# Patient Record
Sex: Female | Born: 1980 | Race: Asian | Hispanic: No | Marital: Single | Smoking: Never smoker
Health system: Southern US, Community
[De-identification: ages and names within clinical notes are randomized; demographics above are authoritative.]

## PROBLEM LIST (undated history)

## (undated) DIAGNOSIS — E282 Polycystic ovarian syndrome: Secondary | ICD-10-CM

## (undated) DIAGNOSIS — E119 Type 2 diabetes mellitus without complications: Secondary | ICD-10-CM

## (undated) HISTORY — PX: APPENDECTOMY: SHX54

## (undated) HISTORY — DX: Polycystic ovarian syndrome: E28.2

---

## 1999-09-14 ENCOUNTER — Ambulatory Visit (HOSPITAL_COMMUNITY): Admission: RE | Admit: 1999-09-14 | Discharge: 1999-09-14 | Payer: Self-pay | Admitting: Family Medicine

## 1999-11-04 ENCOUNTER — Other Ambulatory Visit: Admission: RE | Admit: 1999-11-04 | Discharge: 1999-11-04 | Payer: Self-pay | Admitting: Internal Medicine

## 2001-11-08 ENCOUNTER — Encounter: Payer: Self-pay | Admitting: Family Medicine

## 2001-11-08 ENCOUNTER — Encounter: Admission: RE | Admit: 2001-11-08 | Discharge: 2001-11-08 | Payer: Self-pay | Admitting: Family Medicine

## 2002-07-10 ENCOUNTER — Encounter: Admission: RE | Admit: 2002-07-10 | Discharge: 2002-07-10 | Payer: Self-pay | Admitting: Family Medicine

## 2002-07-10 ENCOUNTER — Encounter: Payer: Self-pay | Admitting: Family Medicine

## 2003-01-27 ENCOUNTER — Encounter: Admission: RE | Admit: 2003-01-27 | Discharge: 2003-01-27 | Payer: Self-pay | Admitting: Family Medicine

## 2003-01-27 ENCOUNTER — Encounter: Payer: Self-pay | Admitting: Family Medicine

## 2003-05-19 ENCOUNTER — Inpatient Hospital Stay (HOSPITAL_COMMUNITY): Admission: EM | Admit: 2003-05-19 | Discharge: 2003-05-20 | Payer: Self-pay | Admitting: Emergency Medicine

## 2003-05-19 ENCOUNTER — Encounter (INDEPENDENT_AMBULATORY_CARE_PROVIDER_SITE_OTHER): Payer: Self-pay

## 2003-05-19 ENCOUNTER — Encounter: Payer: Self-pay | Admitting: Emergency Medicine

## 2005-12-27 ENCOUNTER — Other Ambulatory Visit: Admission: RE | Admit: 2005-12-27 | Discharge: 2005-12-27 | Payer: Self-pay | Admitting: Obstetrics and Gynecology

## 2006-03-19 ENCOUNTER — Encounter (INDEPENDENT_AMBULATORY_CARE_PROVIDER_SITE_OTHER): Payer: Self-pay | Admitting: Family Medicine

## 2006-08-30 ENCOUNTER — Encounter (INDEPENDENT_AMBULATORY_CARE_PROVIDER_SITE_OTHER): Payer: Self-pay | Admitting: Specialist

## 2006-08-30 ENCOUNTER — Other Ambulatory Visit: Admission: RE | Admit: 2006-08-30 | Discharge: 2006-08-30 | Payer: Self-pay | Admitting: Family Medicine

## 2006-08-30 ENCOUNTER — Ambulatory Visit: Payer: Self-pay | Admitting: Family Medicine

## 2006-08-30 LAB — CONVERTED CEMR LAB
HCT: 39.9 % (ref 36.0–46.0)
Hemoglobin: 13.5 g/dL (ref 12.0–15.0)
MCHC: 33.9 g/dL (ref 30.0–36.0)
MCV: 91.3 fL (ref 78.0–100.0)
Platelets: 379 10*3/uL (ref 150–400)
RBC: 4.37 M/uL (ref 3.87–5.11)
RDW: 12.2 % (ref 11.5–14.6)
TSH: 1.2 microintl units/mL (ref 0.35–5.50)
WBC: 5.7 10*3/uL (ref 4.5–10.5)

## 2006-12-13 ENCOUNTER — Ambulatory Visit: Payer: Self-pay | Admitting: Family Medicine

## 2006-12-27 ENCOUNTER — Ambulatory Visit: Payer: Self-pay | Admitting: Family Medicine

## 2006-12-27 LAB — CONVERTED CEMR LAB: Varicella IgG: 2.92 — ABNORMAL HIGH

## 2007-02-26 DIAGNOSIS — J309 Allergic rhinitis, unspecified: Secondary | ICD-10-CM | POA: Insufficient documentation

## 2007-02-28 ENCOUNTER — Ambulatory Visit: Payer: Self-pay | Admitting: Family Medicine

## 2009-01-04 ENCOUNTER — Emergency Department (HOSPITAL_COMMUNITY): Admission: EM | Admit: 2009-01-04 | Discharge: 2009-01-05 | Payer: Self-pay | Admitting: Emergency Medicine

## 2009-05-27 IMAGING — CT CT ANGIO CHEST
2 of 6 series · 19 of 36 positions shown · IV contrast (APPLIED)
Comparison: None.

CLINICAL DATA: Shortness of breath, cough, chest pain.

CT ANGIOGRAPHY CHEST
TECHNIQUE: Multidetector CT imaging of the chest was performed
using the standard protocol during bolus administration of
intravenous contrast. Multiplanar CT image reconstructions
including MIPs were obtained to evaluate the vascular anatomy.
Contrast: 80 ml 0mnipaque-LII

[Series 8: pe thins @ 1mm · axial · 0.56mm/px · z∈[-308,-106]mm · 18 of 226 slices shown]
[im 12/226  lung]
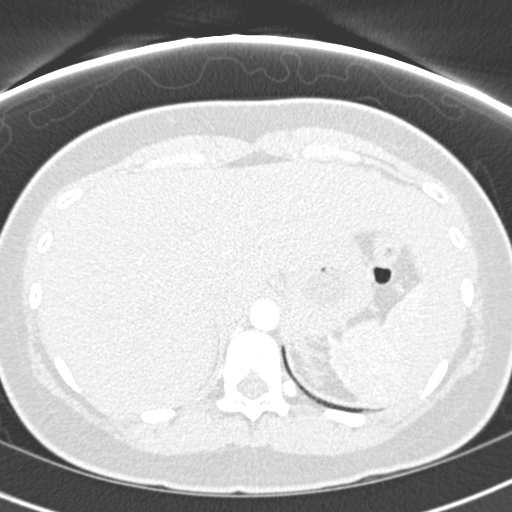
[im 23/226  mediastinal]
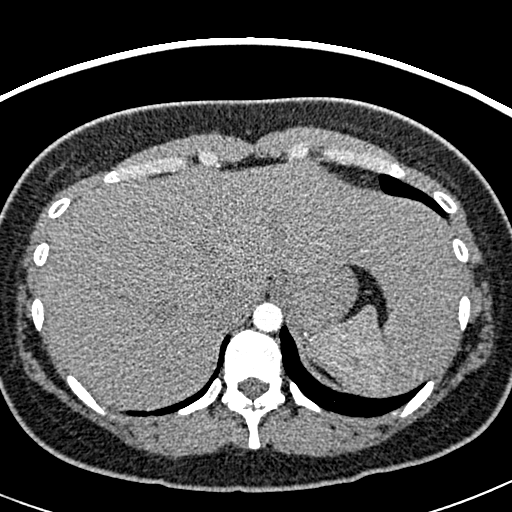
[im 34/226  lung]
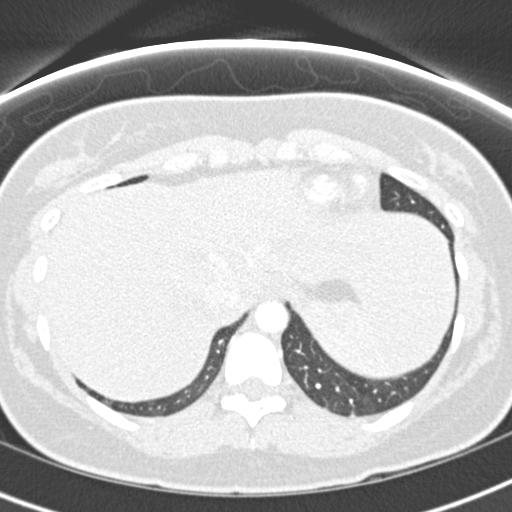
[im 46/226  mediastinal]
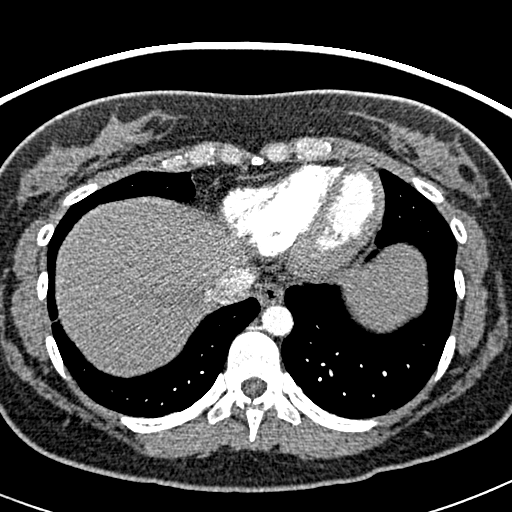
[im 57/226  lung]
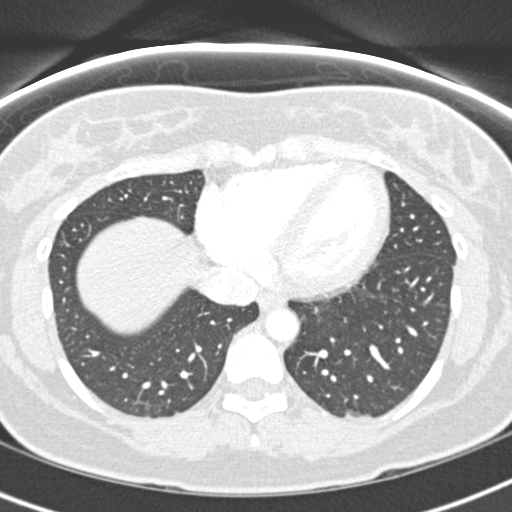
[im 68/226  mediastinal]
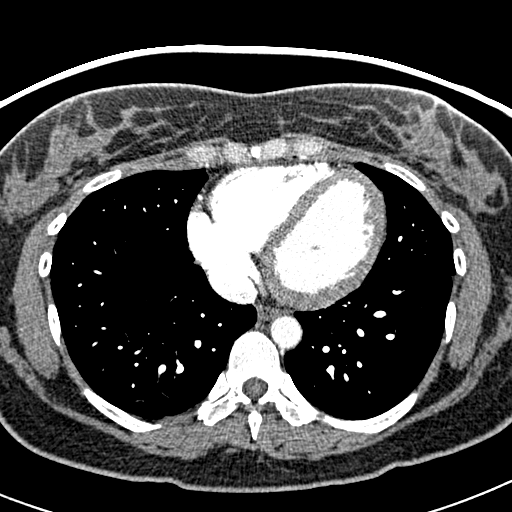
[im 79/226  lung]
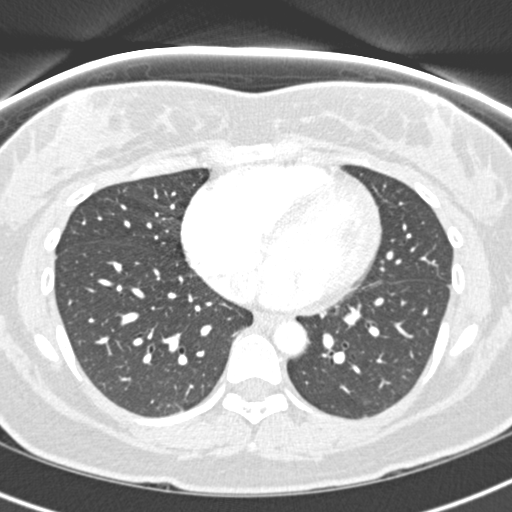
[im 91/226  mediastinal]
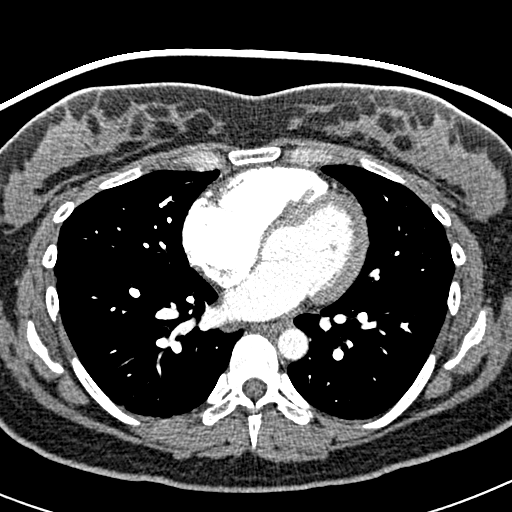
[im 102/226  lung]
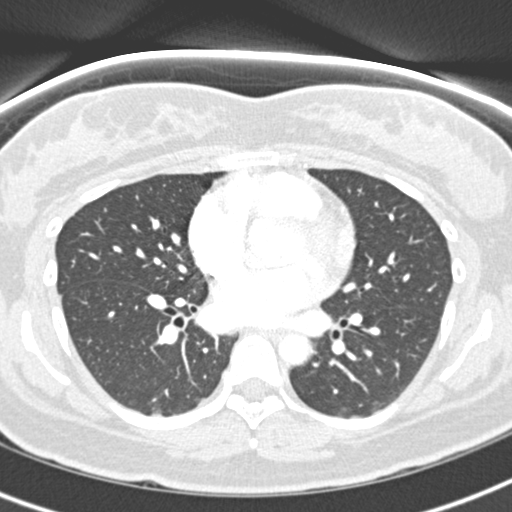
[im 124/226  mediastinal]
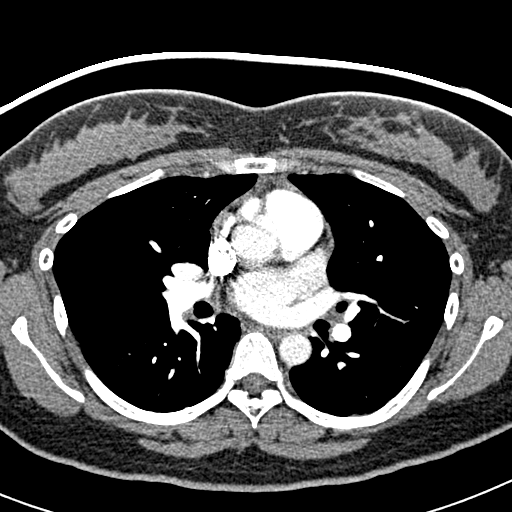
[im 136/226  lung]
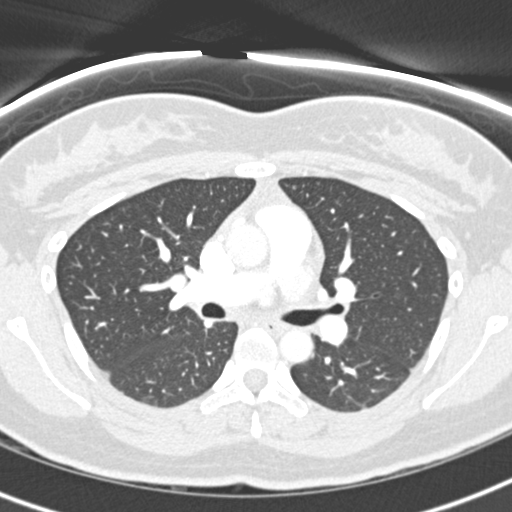
[im 147/226  mediastinal]
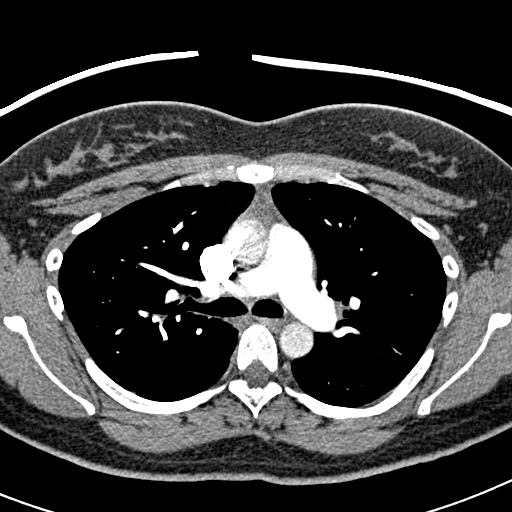
[im 158/226  lung]
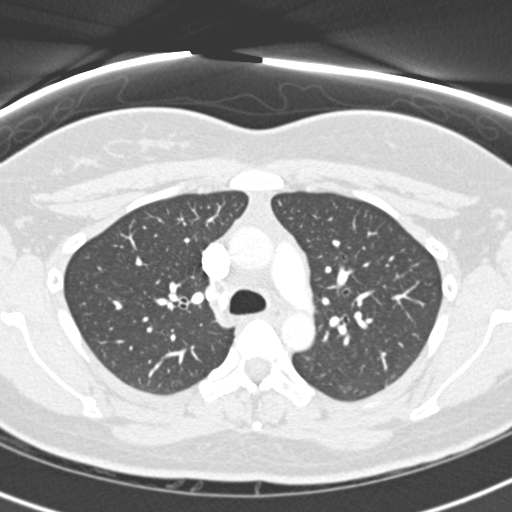
[im 169/226  mediastinal]
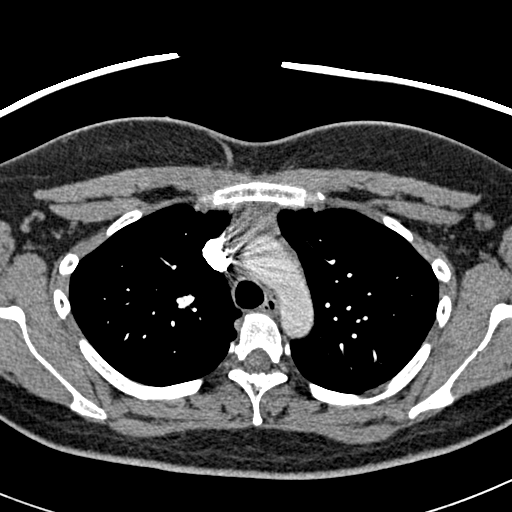
[im 181/226  lung]
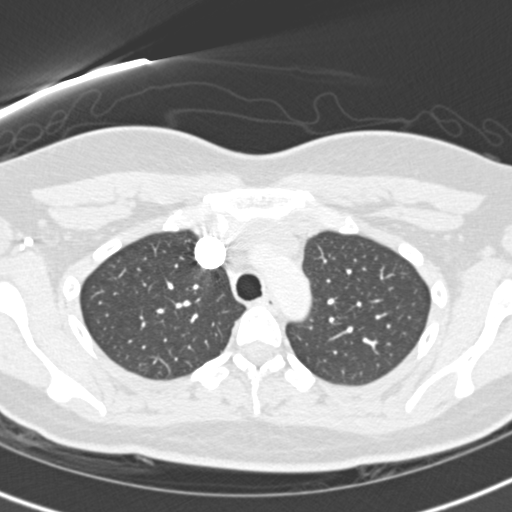
[im 192/226  mediastinal]
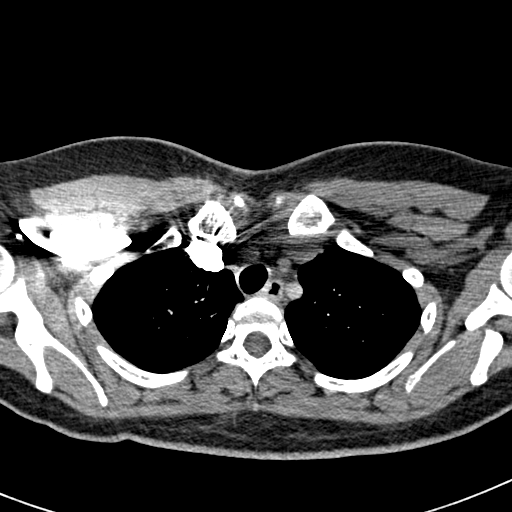
[im 203/226  lung]
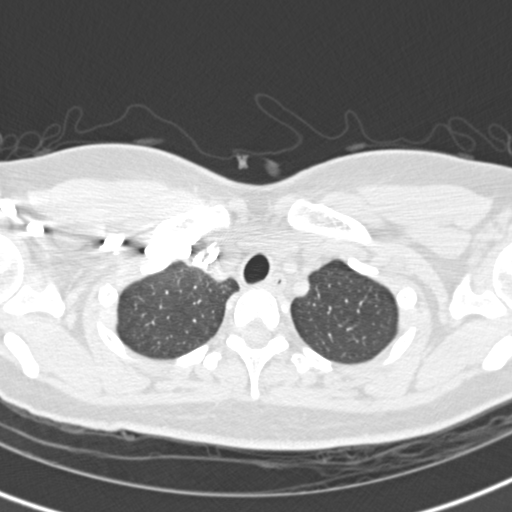
[im 214/226  mediastinal]
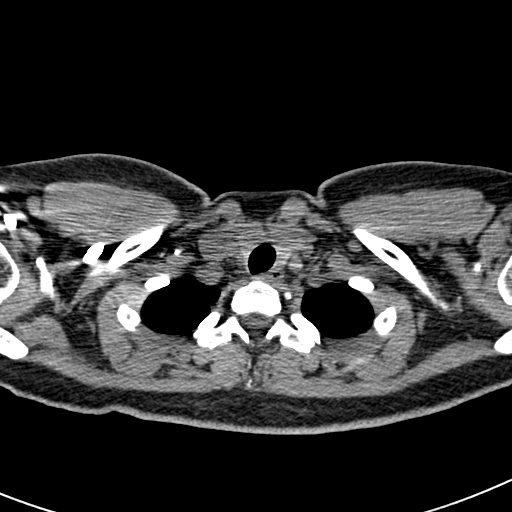

[Series 602: <mpr thick range> · coronal · 0.56mm/px · 1 of 83 slices shown]
[im 42/83  mediastinal]
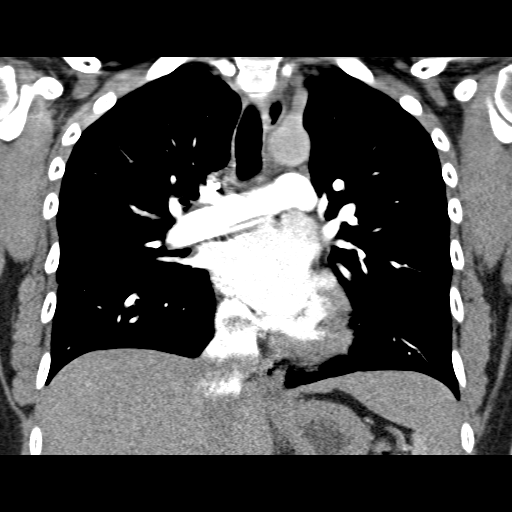

[19 of 36 positions shown; findings below may reference images not displayed]

FINDINGS: No filling defects in the pulmonary arteries to suggest
pulmonary emboli. Heart is normal size. Aorta is normal caliber. No
mediastinal, hilar, or axillary adenopathy.  Visualized thyroid and
chest wall soft tissues unremarkable. Imaging into the upper
abdomen shows no acute findings.  Lungs are clear.  No focal
airspace opacities or suspicious nodules.  No effusions.

 Review of the MIP images confirms the above findings.
IMPRESSION: No evidence of pulmonary embolus.

No acute findings in the chest.

## 2010-12-21 LAB — POCT I-STAT, CHEM 8
Chloride: 107 mEq/L (ref 96–112)
HCT: 43 % (ref 36.0–46.0)
Hemoglobin: 14.6 g/dL (ref 12.0–15.0)
Potassium: 3.3 mEq/L — ABNORMAL LOW (ref 3.5–5.1)

## 2010-12-21 LAB — D-DIMER, QUANTITATIVE: D-Dimer, Quant: 0.51 ug/mL-FEU — ABNORMAL HIGH (ref 0.00–0.48)

## 2011-01-27 NOTE — H&P (Signed)
NAME:  [name], [name] NO.:  [id number]   MEDICAL RECORD NO.:  [id number]                   PATIENT TYPE:  INP   LOCATION:  0101                                 FACILITY:  [facility]   PHYSICIAN:  Ollen Gross. Vernell Morgans, M.D.              DATE OF BIRTH:  1981-02-20   DATE OF ADMISSION:  05/18/2003  DATE OF DISCHARGE:                                HISTORY & PHYSICAL   [name] is a 30 year old Asian American female who presented during the  night with periumbilical pain that started yesterday afternoon.  The pain  quickly moved to her right lower quadrant.  This pain was associated with  significant nausea and vomiting.  She does not recall any fevers.  She has  not had any dysuria or diarrhea associated with this.  She came to the  emergency department for further evaluation.  Her review of systems has  otherwise been negative.   PAST MEDICAL HISTORY:  None.   PAST SURGICAL HISTORY:  None.   MEDICATIONS:  Include birth control pills.   ALLERGIES:  NO KNOWN DRUG ALLERGIES.   SOCIAL HISTORY:  She denies any tobacco or tobacco products.   FAMILY HISTORY:  Only significant for hypertension in her mother.   PHYSICAL EXAMINATION:  VITAL SIGNS:  Temperature 99.5, blood pressure  109/60, pulse of 81.  GENERAL:  She is a well-developed, well-nourished young, white female in no  acute distress but obviously not feeling well.  SKIN:  Warm and dry with no jaundice.  HEENT:  Eyes, her extraocular muscles are intact.  Pupils are equal, round  and reactive to light.  Sclerae anicteric.  LUNGS:  Clear bilaterally with no use of accessory respiratory muscles.  HEART:  Regular rate and rhythm with an impulse in the left chest.  ABDOMEN:  Soft and very tender in the right lower quadrant but without  peritonitis.  I cannot palpate any masses or hepatosplenomegaly.  EXTREMITIES:  No clubbing, cyanosis or edema.  PSYCHOLOGICAL:  She is alert and oriented times three with  no evidence of  state of anxiety or depression.   LABORATORY DATA:  On review of her lab work, her urine pregnancy was  negative, her UA was negative.  On electrolytes, her sodium was 132,  potassium 3.7, chloride 101, CO2 25, BUN 10, creatinine 0.7, glucose 109,  white count 12,700, hemoglobin 13.9, hematocrit 40.2, and platelet count  335,000.  On review of her CT scan, she does have an enlarged appendix with  surrounding inflammation.   ASSESSMENT AND PLAN:  This is a 30 year old white female with acute  appendicitis.  I have explained the risks of rupture to her and her family  and discussed the risks and benefits of the operation to remove the  appendix.  They  understand and wish to proceed.  We will plan to do this for her this  morning in the operating  room and we would admit her for IV fluid hydration,  broad spectrum antibiotic coverage, and will make arrangements with the OR  for the operation.                                               Ollen Gross. Vernell Morgans, M.D.    PST/MEDQ  D:  05/19/2003  T:  05/19/2003  Job:  [id number]

## 2011-01-27 NOTE — Op Note (Signed)
NAME:  [name], [name] NO.:  [id number]   MEDICAL RECORD NO.:  [id number]                   PATIENT TYPE:  INP   LOCATION:  0482                                 FACILITY:  [facility]   PHYSICIAN:  Ollen Gross. Vernell Morgans, M.D.              DATE OF BIRTH:  Mar 22, 1981   DATE OF PROCEDURE:  05/19/2003  DATE OF DISCHARGE:                                 OPERATIVE REPORT   PREOPERATIVE DIAGNOSIS:  Appendicitis.   POSTOPERATIVE DIAGNOSIS:  Appendicitis.   PROCEDURE:  Laparoscopic appendectomy.   SURGEON:  Ollen Gross. Carolynne Edouard, M.D.   ANESTHESIA:  General endotracheal.   DESCRIPTION OF PROCEDURE:  After informed consent was obtained, the patient  was brought to the operating room and placed in the supine position on the  operating table. After adequate induction of general anesthesia, the  patient's abdomen was prepped with Betadine and draped in the usual sterile  manner. The area below the umbilicus was infiltrated with 0.25% Marcaine, a  small incision was made with a 15 blade knife. This incision was carried  down through the subcutaneous tissue bluntly with a Kelly clamp and [employer] retractors until the linea alba was identified. The linea alba was  incised with a 15 blade knife, each side was grasped with Kocher clamps and  elevated anteriorly. The preperitoneal space was then probed bluntly with  the hemostat until the peritoneum was opened and access was gained to the  abdominal cavity. A #0 Vicryl pursestring suture was placed in the fascia  surrounding the opening, a Hasson cannula was placed through the opening and  anchored in place with the previously placed Vicryl pursestring stitch. The  abdomen was then insufflated with carbon dioxide without difficulty. A  laparoscope was placed through the Hasson cannula, the right lower quadrant  was inspected, the patient was placed in Trendelenburg position with the  right side rotated up. The appendix was readily  identified and appeared to  be enlarged and inflamed. Next the suprapubic area was infiltrated with  0.25% Marcaine, a small incision was made with a 15 blade knife and a 12 mm  port was placed bluntly through this incision into the abdominal cavity  under direct vision. The epigastric area was then infiltrated with 0.25%  Marcaine and a small stab incision was then made with a 15 blade knife and a  5 mm port was placed bluntly through this incision into the abdominal cavity  under direct vision. A Glassman type retractor was placed through epigastric  port and a harmonic scalpel was placed through the suprapubic port. The  appendix again was readily identified and able to be elevated up into the  air. The mesoappendix was taken down with the harmonic scalpel to the base  of the appendix. Once the base of the appendix was free and clear, the  harmonic scalpel was replaced with a laparoscopic stapler which was  placed  across the base of the appendix. The stapling device was clamped and after a  minute fired, thereby dividing the appendix between staple lines. The  stapler was then removed, a laparoscopic bag was placed through the 12 mm  port and the appendix was placed within the bag and the bag was sealed. The  area where the appendix had been was then irrigated with copious amounts of  saline, the staple line was inspected and appeared to be intact and was  hemostatic. The pelvis was then also inspected and irrigated with copious  amounts of saline. The laparoscope was then moved to the suprapubic port. A  grasper was then placed through the Hasson cannula and used to grasp the  opening of the bag. The bag with the appendix was then removed with the  Hasson cannula through the infraumbilical port without difficulty. The  fascial defect was closed with the previously placed Vicryl pursestring  stitch as well as another interrupted single #0 Vicryl stitch. The rest of  the ports were  removed under direct vision and were found to be hemostatic.  The gas was allowed to escape. The fascia of the suprapubic port was then  also closed with a single interrupted #0 Vicryl stitch. The skin incisions  were then all closed with interrupted 4-0 Monocryl subcuticular stitches,  Benzoin and Steri-Strips and sterile dressings were applied. The patient  tolerated the procedure well. At the end of the case, all sponge, needle and  instrument counts were correct. The patient was then awakened and taken to  the recovery room in stable condition.                                               Ollen Gross. Vernell Morgans, M.D.    PST/MEDQ  D:  05/19/2003  T:  05/19/2003  Job:  [id number]

## 2015-09-12 NOTE — L&D Delivery Note (Signed)
[name] is a G1P0000 at 8411w5d induced for non-reassuring fetal testing.  Pregnancy was complicated by preeclampsia and A2GDM-glyburide. The patient's labor progressed well. After being complete for 1 hr the patient started pushing and did so effectively. The infant became tachycardic and has shallow variables during this time. I was called to the room while the patient was pushing. CBGs during labor were well controlled.  There was concern for developing triple I but patient did not meet criteria by fever or maternal tachycardia. There was fetal tachycardia per above that developed while pushing.   Delivery Note At 4:28 PM a viable female was delivered via Vaginal, Spontaneous Delivery (Presentation: OA).  APGAR: 5, 9; weight - pending. Placenta status: Intact, meconium stained-sent to pathology  Cord: 3 VC  with the following complications: FHR deceleration with crowing. Infant was initially hypotonic but then gave good cries. However we did not perform delayed cord clamping and the cord was cut and infant was transferred to the warmer and the awaiting NICU team. After initial assessment the NICU team brought the infant to the mother. Cord pH: drawn but was clotted and thus did not result.  Anesthesia:  Epidural Episiotomy: None Lacerations: 2nd degree- repaired in typical fashion. Suture Repair: 3.0 monocryl Est. Blood Loss (mL): 300  Mom to postpartum.  Baby to Couplet care / Skin to Skin.  [name] 06/07/2016, 4:59 PM

## 2015-11-23 LAB — OB RESULTS CONSOLE PLATELET COUNT: Platelets: 371 10*3/uL

## 2015-11-23 LAB — OB RESULTS CONSOLE HGB/HCT, BLOOD
HCT: 40 %
Hemoglobin: 11 g/dL

## 2015-11-23 LAB — OB RESULTS CONSOLE ABO/RH: RH Type: POSITIVE

## 2015-11-23 LAB — CYSTIC FIBROSIS DIAGNOSTIC STUDY: Interpretation-CFDNA:: NEGATIVE

## 2015-11-23 LAB — OB RESULTS CONSOLE HEPATITIS B SURFACE ANTIGEN: HEP B S AG: NEGATIVE

## 2015-11-23 LAB — OB RESULTS CONSOLE ANTIBODY SCREEN: ANTIBODY SCREEN: NEGATIVE

## 2015-11-23 LAB — OB RESULTS CONSOLE RUBELLA ANTIBODY, IGM: RUBELLA: IMMUNE

## 2015-11-23 LAB — OB RESULTS CONSOLE HIV ANTIBODY (ROUTINE TESTING): HIV: NONREACTIVE

## 2015-11-23 LAB — OB RESULTS CONSOLE RPR: RPR: NONREACTIVE

## 2016-05-05 ENCOUNTER — Ambulatory Visit (INDEPENDENT_AMBULATORY_CARE_PROVIDER_SITE_OTHER): Payer: BLUE CROSS/BLUE SHIELD | Admitting: Obstetrics and Gynecology

## 2016-05-05 ENCOUNTER — Encounter: Payer: Self-pay | Admitting: Obstetrics and Gynecology

## 2016-05-05 ENCOUNTER — Encounter: Payer: Self-pay | Admitting: *Deleted

## 2016-05-05 VITALS — BP 122/89 | HR 86 | Ht 62.0 in | Wt 172.0 lb

## 2016-05-05 DIAGNOSIS — O0993 Supervision of high risk pregnancy, unspecified, third trimester: Secondary | ICD-10-CM

## 2016-05-05 DIAGNOSIS — O24419 Gestational diabetes mellitus in pregnancy, unspecified control: Secondary | ICD-10-CM | POA: Diagnosis not present

## 2016-05-05 DIAGNOSIS — O0990 Supervision of high risk pregnancy, unspecified, unspecified trimester: Secondary | ICD-10-CM | POA: Insufficient documentation

## 2016-05-05 DIAGNOSIS — I471 Supraventricular tachycardia: Secondary | ICD-10-CM | POA: Insufficient documentation

## 2016-05-05 LAB — POCT URINALYSIS DIP (DEVICE)
BILIRUBIN URINE: NEGATIVE
Glucose, UA: NEGATIVE mg/dL
KETONES UR: NEGATIVE mg/dL
Leukocytes, UA: NEGATIVE
Nitrite: NEGATIVE
PH: 7 (ref 5.0–8.0)
Protein, ur: NEGATIVE mg/dL
SPECIFIC GRAVITY, URINE: 1.015 (ref 1.005–1.030)
Urobilinogen, UA: 0.2 mg/dL (ref 0.0–1.0)

## 2016-05-05 LAB — GLUCOSE TOLERANCE, 3 HOURS
GLUCOSE 2 HOUR GTT: 153 mg/dL — AB (ref ?–140)
Glucose, GTT - 1 Hour: 240 mg/dL — AB (ref ?–200)
Glucose, GTT - Fasting: 97 mg/dL (ref 80–110)

## 2016-05-05 LAB — CYTOLOGY - PAP: Pap: NEGATIVE

## 2016-05-05 MED ORDER — GLYBURIDE 2.5 MG PO TABS: 2.5000 mg | ORAL_TABLET | Freq: Two times a day (BID) | ORAL | 3 refills | Status: DC

## 2016-05-05 NOTE — Addendum Note (Signed)
Addended byGerome Apley: ZEYFANG, LINDA L on: 05/05/2016 12:10 PM   Modules accepted: Orders

## 2016-05-05 NOTE — Progress Notes (Signed)
  Subjective:    [name] is a G1P0000 763w0d being seen today for her first obstetrical visit.  Her obstetrical history is significant for advanced maternal age and gesdational diaetes on glyburide. Patient does intend to breast feed. Pregnancy history fully reviewed.  Patient reports no complaints.  Vitals:   05/05/16 0935 05/05/16 0950  BP:  122/89  Pulse:  86  Weight:  172 lb (78 kg)  Height: 5\' 2"  (1.575 m)     HISTORY: OB History  Gravida Para Term Preterm AB Living  1 0 0 0 0 0  SAB TAB Ectopic Multiple Live Births  0 0 0 0 0    # Outcome Date GA Lbr Len/2nd Weight Sex Delivery Anes PTL Lv  1 Current              Past Medical History:  Diagnosis Date  . PCOS (polycystic ovarian syndrome)    Past Surgical History:  Procedure Laterality Date  . APPENDECTOMY     Family History  Problem Relation Age of Onset  . Hypertension Mother   . Hypertension Father   . Cancer Maternal Grandmother      Exam    Not perfomed    Assessment:    Pregnancy: G1P0000 Patient Active Problem List   Diagnosis Date Noted  . Supervision of high risk pregnancy, antepartum 05/05/2016  . Gestational diabetes mellitus (GDM) affecting pregnancy 05/05/2016  . Paroxysmal SVT (supraventricular tachycardia) (HCC) 05/05/2016  . ALLERGIC RHINITIS 02/26/2007        Plan:     Initial labs drawn. Prenatal vitamins. Problem list reviewed and updated. Genetic Screening discussed : results reviewed.  Ultrasound discussed; fetal survey: results reviewed. Patient started glyburide 2 days ago. Her fasting have significantly improved but postprandials remain in 130-160. Will increase glyburide to BID regimen (2.5 mg BID) Patient is currently not working right now but I encouraged her to stay active. She tries to walk 1 mile daily Growth ultrasound scheduled Patient denies any further episodes of SVT since her recent episode in August.  Follow up in 1 weeks. NST reviewed and  reactive 50% of 30 min visit spent on counseling and coordination of care.     [name] 05/05/2016

## 2016-05-09 ENCOUNTER — Other Ambulatory Visit: Payer: Self-pay

## 2016-05-09 ENCOUNTER — Encounter (HOSPITAL_COMMUNITY): Payer: Self-pay | Admitting: Obstetrics and Gynecology

## 2016-05-09 ENCOUNTER — Ambulatory Visit (INDEPENDENT_AMBULATORY_CARE_PROVIDER_SITE_OTHER): Payer: BLUE CROSS/BLUE SHIELD | Admitting: Advanced Practice Midwife

## 2016-05-09 VITALS — BP 122/80 | HR 89 | Wt 171.2 lb

## 2016-05-09 DIAGNOSIS — O24419 Gestational diabetes mellitus in pregnancy, unspecified control: Secondary | ICD-10-CM

## 2016-05-09 NOTE — Patient Instructions (Signed)

## 2016-05-09 NOTE — Progress Notes (Signed)
NST reactive.

## 2016-05-11 ENCOUNTER — Other Ambulatory Visit: Payer: Self-pay

## 2016-05-12 ENCOUNTER — Ambulatory Visit (INDEPENDENT_AMBULATORY_CARE_PROVIDER_SITE_OTHER): Payer: BLUE CROSS/BLUE SHIELD | Admitting: Family Medicine

## 2016-05-12 VITALS — BP 123/80 | HR 93

## 2016-05-12 DIAGNOSIS — O24419 Gestational diabetes mellitus in pregnancy, unspecified control: Secondary | ICD-10-CM

## 2016-05-12 DIAGNOSIS — Z36 Encounter for antenatal screening of mother: Secondary | ICD-10-CM | POA: Diagnosis not present

## 2016-05-12 NOTE — Progress Notes (Signed)
US for growth scheduled 9/8 - BPP added

## 2016-05-12 NOTE — Progress Notes (Signed)
NST reactive.

## 2016-05-17 ENCOUNTER — Ambulatory Visit (INDEPENDENT_AMBULATORY_CARE_PROVIDER_SITE_OTHER): Payer: BLUE CROSS/BLUE SHIELD | Admitting: Obstetrics & Gynecology

## 2016-05-17 VITALS — BP 125/79 | HR 90 | Wt 173.0 lb

## 2016-05-17 DIAGNOSIS — O24419 Gestational diabetes mellitus in pregnancy, unspecified control: Secondary | ICD-10-CM

## 2016-05-17 DIAGNOSIS — O0993 Supervision of high risk pregnancy, unspecified, third trimester: Secondary | ICD-10-CM

## 2016-05-17 DIAGNOSIS — Z113 Encounter for screening for infections with a predominantly sexual mode of transmission: Secondary | ICD-10-CM

## 2016-05-17 LAB — POCT URINALYSIS DIP (DEVICE)
BILIRUBIN URINE: NEGATIVE
Glucose, UA: NEGATIVE mg/dL
Hgb urine dipstick: NEGATIVE
Ketones, ur: 40 mg/dL — AB
LEUKOCYTES UA: NEGATIVE
NITRITE: NEGATIVE
PH: 6 (ref 5.0–8.0)
Protein, ur: NEGATIVE mg/dL
Specific Gravity, Urine: 1.01 (ref 1.005–1.030)
UROBILINOGEN UA: 0.2 mg/dL (ref 0.0–1.0)

## 2016-05-17 LAB — OB RESULTS CONSOLE GC/CHLAMYDIA: GC PROBE AMP, GENITAL: NEGATIVE

## 2016-05-17 LAB — OB RESULTS CONSOLE GBS: GBS: NEGATIVE

## 2016-05-17 NOTE — Progress Notes (Signed)
   PRENATAL VISIT NOTE  Subjective:  [name] is a 35 y.o. G1P0000 at 214w5d being seen today for ongoing prenatal care.  She is currently monitored for the following issues for this high-risk pregnancy and has ALLERGIC RHINITIS; Supervision of high risk pregnancy, antepartum; Gestational diabetes mellitus (GDM) affecting pregnancy; and Paroxysmal SVT (supraventricular tachycardia) (HCC) on her problem list.  Patient reports no complaints.  Contractions: Irregular. Vag. Bleeding: None.  Movement: Present. Denies leaking of fluid.   The following portions of the patient's history were reviewed and updated as appropriate: allergies, current medications, past family history, past medical history, past social history, past surgical history and problem list. Problem list updated.  Objective:   Vitals:   05/17/16 1302  BP: 125/79  Pulse: 90  Weight: 173 lb (78.5 kg)    Fetal Status: Fetal Heart Rate (bpm): NST Fundal Height: 36 cm Movement: Present  Presentation: Vertex  General:  Alert, oriented and cooperative. Patient is in no acute distress.  Skin: Skin is warm and dry. No rash noted.   Cardiovascular: Normal heart rate noted  Respiratory: Normal respiratory effort, no problems with respiration noted  Abdomen: Soft, gravid, appropriate for gestational age. Pain/Pressure: Present     Pelvic:  Cervical exam performed Dilation: Closed Effacement (%): 50 Station: -3  Extremities: Normal range of motion.  Edema: None  Mental Status: Normal mood and affect. Normal behavior. Normal judgment and thought content.   Urinalysis: Urine Protein: Negative Urine Glucose: Negative  CBGs are within normal range, just one postprandial. NST performed today was reviewed and was found to be reactive.    Assessment and Plan:  Pregnancy: G1P0000 at 634w5d  1. Gestational diabetes mellitus (GDM) affecting pregnancy Continue Glyburide 2.5 mg po bid. Continue recommended antenatal testing and  prenatal care. Desires IOL at 2555w3d if needed, she was told it will be fine as long as antenatal testing is fine. - Fetal nonstress test  2. Supervision of high risk pregnancy, antepartum, third trimester Pelvic cultures done - Culture, beta strep (group b only) - GC/Chlamydia probe amp ([facility] Preterm labor symptoms and general obstetric precautions including but not limited to vaginal bleeding, contractions, leaking of fluid and fetal movement were reviewed in detail with the patient. Please refer to After Visit Summary for other counseling recommendations.  Return in about 6 days (around 05/23/2016) for Ob fu and NST.  Tereso NewcomerUgonna A Anyanwu, MD

## 2016-05-17 NOTE — Patient Instructions (Signed)
Return to clinic for any scheduled appointments or obstetric concerns, or go to [facility] for evaluation  

## 2016-05-17 NOTE — Progress Notes (Signed)
US & BPP scheduled 9/8

## 2016-05-18 LAB — GC/CHLAMYDIA PROBE AMP (~~LOC~~) NOT AT ARMC
CHLAMYDIA, DNA PROBE: NEGATIVE
NEISSERIA GONORRHEA: NEGATIVE

## 2016-05-19 ENCOUNTER — Ambulatory Visit (HOSPITAL_COMMUNITY)
Admission: RE | Admit: 2016-05-19 | Discharge: 2016-05-19 | Disposition: A | Discharge status: Home / Self Care | Payer: BLUE CROSS/BLUE SHIELD | Source: Ambulatory Visit | Attending: Obstetrics and Gynecology | Admitting: Obstetrics and Gynecology

## 2016-05-19 ENCOUNTER — Encounter (HOSPITAL_COMMUNITY): Payer: Self-pay

## 2016-05-19 DIAGNOSIS — O24415 Gestational diabetes mellitus in pregnancy, controlled by oral hypoglycemic drugs: Secondary | ICD-10-CM | POA: Diagnosis not present

## 2016-05-19 DIAGNOSIS — O09513 Supervision of elderly primigravida, third trimester: Secondary | ICD-10-CM | POA: Diagnosis present

## 2016-05-19 DIAGNOSIS — Z3A36 36 weeks gestation of pregnancy: Secondary | ICD-10-CM | POA: Diagnosis not present

## 2016-05-19 DIAGNOSIS — O0993 Supervision of high risk pregnancy, unspecified, third trimester: Secondary | ICD-10-CM

## 2016-05-19 DIAGNOSIS — O24419 Gestational diabetes mellitus in pregnancy, unspecified control: Secondary | ICD-10-CM

## 2016-05-19 LAB — CULTURE, BETA STREP (GROUP B ONLY)

## 2016-05-23 ENCOUNTER — Ambulatory Visit (INDEPENDENT_AMBULATORY_CARE_PROVIDER_SITE_OTHER): Payer: BLUE CROSS/BLUE SHIELD | Admitting: Advanced Practice Midwife

## 2016-05-23 VITALS — BP 124/91 | HR 81 | Wt 172.3 lb

## 2016-05-23 DIAGNOSIS — O163 Unspecified maternal hypertension, third trimester: Secondary | ICD-10-CM

## 2016-05-23 DIAGNOSIS — O24419 Gestational diabetes mellitus in pregnancy, unspecified control: Secondary | ICD-10-CM | POA: Diagnosis not present

## 2016-05-23 DIAGNOSIS — O0993 Supervision of high risk pregnancy, unspecified, third trimester: Secondary | ICD-10-CM

## 2016-05-23 LAB — POCT URINALYSIS DIP (DEVICE)
BILIRUBIN URINE: NEGATIVE
Glucose, UA: NEGATIVE mg/dL
HGB URINE DIPSTICK: NEGATIVE
Ketones, ur: 15 mg/dL — AB
Leukocytes, UA: NEGATIVE
NITRITE: NEGATIVE
PH: 6.5 (ref 5.0–8.0)
Protein, ur: NEGATIVE mg/dL
Specific Gravity, Urine: 1.02 (ref 1.005–1.030)
UROBILINOGEN UA: 0.2 mg/dL (ref 0.0–1.0)

## 2016-05-23 LAB — COMPREHENSIVE METABOLIC PANEL
ALT: 10 U/L (ref 6–29)
AST: 20 U/L (ref 10–30)
Albumin: 2.9 g/dL — ABNORMAL LOW (ref 3.6–5.1)
Alkaline Phosphatase: 102 U/L (ref 33–115)
BILIRUBIN TOTAL: 0.3 mg/dL (ref 0.2–1.2)
BUN: 11 mg/dL (ref 7–25)
CO2: 19 mmol/L — AB (ref 20–31)
CREATININE: 0.58 mg/dL (ref 0.50–1.10)
Calcium: 9 mg/dL (ref 8.6–10.2)
Chloride: 106 mmol/L (ref 98–110)
GLUCOSE: 104 mg/dL — AB (ref 65–99)
Potassium: 4 mmol/L (ref 3.5–5.3)
SODIUM: 136 mmol/L (ref 135–146)
Total Protein: 5.7 g/dL — ABNORMAL LOW (ref 6.1–8.1)

## 2016-05-23 LAB — CBC
HCT: 34.8 % — ABNORMAL LOW (ref 35.0–45.0)
Hemoglobin: 11.2 g/dL — ABNORMAL LOW (ref 11.7–15.5)
MCH: 27.5 pg (ref 27.0–33.0)
MCHC: 32.2 g/dL (ref 32.0–36.0)
MCV: 85.3 fL (ref 80.0–100.0)
MPV: 9.3 fL (ref 7.5–12.5)
Platelets: 284 10*3/uL (ref 140–400)
RBC: 4.08 MIL/uL (ref 3.80–5.10)
RDW: 14.6 % (ref 11.0–15.0)
WBC: 7.4 10*3/uL (ref 3.8–10.8)

## 2016-05-23 MED ORDER — GLYBURIDE 2.5 MG PO TABS: ORAL_TABLET | ORAL | 3 refills | Status: DC

## 2016-05-23 MED ORDER — GLUCOSE BLOOD VI STRP: ORAL_STRIP | 12 refills | Status: DC

## 2016-05-23 NOTE — Progress Notes (Signed)
Pt reports some elevated BP's @ home - denies headaches or visual disturbances.

## 2016-05-23 NOTE — Progress Notes (Signed)
   PRENATAL VISIT NOTE  Subjective:  [name] is a 35 y.o. G1P0000 at [redacted]w[redacted]d being seen today for ongoing prenatal care.  She is currently monitored for the following issues for this high-risk pregnancy and has ALLERGIC RHINITIS; Supervision of high risk pregnancy, antepartum; Gestational diabetes mellitus (GDM) affecting pregnancy; and Paroxysmal SVT (supraventricular tachycardia) (HCC) on her problem list.  Patient reports no complaints.  Contractions: Irregular. Vag. Bleeding: None.  Movement: Present. Denies leaking of fluid.   The following portions of the patient's history were reviewed and updated as appropriate: allergies, current medications, past family history, past medical history, past social history, past surgical history and problem list. Problem list updated.  Objective:   Vitals:   05/23/16 0839  BP: (!) 124/91  Pulse: 81  Weight: 172 lb 4.8 oz (78.2 kg)    Fetal Status: Fetal Heart Rate (bpm): NST - R Fundal Height: 37 cm Movement: Present     General:  Alert, oriented and cooperative. Patient is in no acute distress.  Skin: Skin is warm and dry. No rash noted.   Cardiovascular: Normal heart rate noted  Respiratory: Normal respiratory effort, no problems with respiration noted  Abdomen: Soft, gravid, appropriate for gestational age. Pain/Pressure: Present     Pelvic:  Cervical exam deferred        Extremities: Normal range of motion.  Edema: Trace  Mental Status: Normal mood and affect. Normal behavior. Normal judgment and thought content.   Urinalysis:      Assessment and Plan:  Pregnancy: G1P0000 at [redacted]w[redacted]d  1. Gestational diabetes mellitus (GDM) affecting pregnancy --Twice weekly testing - Fetal nonstress test reactive today --Glucose log with fastings in 70s-low 90s but 1/2 of PP are elevated 120s to 180s with one in low 200s. Pt reports she is eating strict diet but then sometimes binges on carbohydrates because she is so hungry. Discussed dietary  changes to feel more full, including higher fiber foods, small amount of high fiber carbohydrates with each meal instead of binging on bread, etc between meals.  Pt states understanding.   --Adjust morning dose of Glyburide to 5 mg, keep evening dose at 2.5 mg. Follow up next week.  - glyBURIDE (DIABETA) 2.5 MG tablet; Take 5 mg daily with breakfast and 2.5 mg daily with the evening meal  Dispense: 60 tablet; Refill: 3  2. Supervision of high risk pregnancy, antepartum, third trimester   3. Hypertension affecting pregnancy in third trimester, antepartum --Pt taking BP at home daily and reports occasional diastolics of low 90s.  BP 124/91 today.  Pt denies h/a, epigastric pain, visual disturbances.  Labwork today, preeclampsia precautions reviewed. - CBC - Comp Met (CMET) --P/C ratio  Term labor symptoms and general obstetric precautions including but not limited to vaginal bleeding, contractions, leaking of fluid and fetal movement were reviewed in detail with the patient. Please refer to After Visit Summary for other counseling recommendations.  Return in about 1 week (around 05/30/2016) for Ob fu and NST.  Lisa A [name], CNM 

## 2016-05-23 NOTE — Addendum Note (Signed)
Addended by: Sharen CounterLEFTWICH-KIRBY, Sharissa Brierley A on: 05/23/2016 08:52 AM   Modules accepted: Orders

## 2016-05-24 LAB — PROTEIN / CREATININE RATIO, URINE
Creatinine, Urine: 111 mg/dL (ref 20–320)
Protein Creatinine Ratio: 171 mg/g creat — ABNORMAL HIGH (ref 21–161)
Total Protein, Urine: 19 mg/dL (ref 5–24)

## 2016-05-26 ENCOUNTER — Ambulatory Visit (INDEPENDENT_AMBULATORY_CARE_PROVIDER_SITE_OTHER): Payer: BLUE CROSS/BLUE SHIELD | Admitting: Obstetrics and Gynecology

## 2016-05-26 VITALS — BP 133/88 | HR 75

## 2016-05-26 DIAGNOSIS — Z36 Encounter for antenatal screening of mother: Secondary | ICD-10-CM

## 2016-05-26 DIAGNOSIS — O24419 Gestational diabetes mellitus in pregnancy, unspecified control: Secondary | ICD-10-CM

## 2016-05-26 NOTE — Progress Notes (Signed)
Pt denies H/A or visual disturbances. Today's BP readings and plan of care discussed with Dr. Vergie LivingPickens. Pt advised of sx of pre-eclampsia and when to return to hospital. She voiced understanding. Continue twice weekly testing as scheduled

## 2016-05-26 NOTE — Progress Notes (Signed)
NST Note Date: 05/26/2016 Gestational age: 36/0 145 baseline, +accels, no decels, mod variability  A/P: RNST, continue with current plan of care Will continue to watch BPs. Precautions given to patient.  Cornelia Copaharlie Gertie Broerman, Jr MD Attending [facility](Faculty Practice)

## 2016-05-30 ENCOUNTER — Ambulatory Visit (INDEPENDENT_AMBULATORY_CARE_PROVIDER_SITE_OTHER): Payer: BLUE CROSS/BLUE SHIELD | Admitting: Family

## 2016-05-30 VITALS — BP 139/86 | HR 85 | Wt 175.3 lb

## 2016-05-30 DIAGNOSIS — O0993 Supervision of high risk pregnancy, unspecified, third trimester: Secondary | ICD-10-CM

## 2016-05-30 DIAGNOSIS — O24419 Gestational diabetes mellitus in pregnancy, unspecified control: Secondary | ICD-10-CM

## 2016-05-30 DIAGNOSIS — Z23 Encounter for immunization: Secondary | ICD-10-CM

## 2016-05-30 LAB — POCT URINALYSIS DIP (DEVICE)
Bilirubin Urine: NEGATIVE
GLUCOSE, UA: NEGATIVE mg/dL
Ketones, ur: NEGATIVE mg/dL
Leukocytes, UA: NEGATIVE
Nitrite: NEGATIVE
PH: 6.5 (ref 5.0–8.0)
PROTEIN: NEGATIVE mg/dL
SPECIFIC GRAVITY, URINE: 1.02 (ref 1.005–1.030)
UROBILINOGEN UA: 0.2 mg/dL (ref 0.0–1.0)

## 2016-05-30 MED ORDER — GLYBURIDE 2.5 MG PO TABS: ORAL_TABLET | ORAL | 1 refills | Status: DC

## 2016-05-30 MED ORDER — GLYBURIDE 2.5 MG PO TABS: 6.2500 mg | ORAL_TABLET | Freq: Two times a day (BID) | ORAL | 1 refills | Status: DC

## 2016-05-30 NOTE — Progress Notes (Signed)
.  cwh 

## 2016-05-30 NOTE — Patient Instructions (Signed)
AREA PEDIATRIC/FAMILY PRACTICE PHYSICIANS  [facility] [address], [address] [place], [place]  [address] Phone - [phone]   Fax - [phone]  [facility] [address] [place], [place] [address] Phone - [phone]   Fax - [phone]  [facility] [address] [place], [place]  [address] Phone - [phone]   Fax - [phone]  [facility] [address] [place], [place]  [address] Phone - [phone]   Fax - [phone]  [facility] [address] [place], [place]  [address] Phone - [phone]   Fax - [phone]  [facility] [address], [address] [place], [place]  [address] Phone - [phone]   Fax - [phone]  [facility] [address] [place], [place]  [address] Phone - [phone]   Fax - [phone]  [facility] [address], [address] [place], [place]  [address] Phone - [phone]   Fax - [phone]  [facility] [address] [place], [place]  [address] Phone - [phone]   Fax - [phone] [facility] [address] [place], [place]  [address] Phone - [phone]   Fax - [phone]  [facility] [address] [place], [place]  [address] Phone - [phone]   Fax - [phone]  [facility] [address] [place], [place]  [address] Phone - [phone]   Fax - [phone]  [facility] [address], [address] [place], [place]  [address] Phone - [phone]   Fax - [phone]  [facility] [address] [place], [place]  [address] Phone - [phone]  [facility] [address] [place], [place]  [address] Phone - [phone]   Fax - [phone]  [facility] [address] [place], [place]  [address] Phone - [phone]   Fax - [phone]  [facility] [address] [place], [place]  [address] Phone - [phone]   Fax - [phone]   [facility] [address] [place], [place]  [address] Phone - [phone]   Fax - [phone]   [facility] [address] [place], [place]  [address] Phone - [phone]   Fax - [phone]  [facility] [address] [place], [place]  [address] Phone - [phone]   Fax - [phone]  [facility] [address] [place], [place]  [address] Phone - [phone]   Fax - [phone]  [facility] [address] [place], [place]  [address] Phone - [phone]   Fax - [phone]  [facility] [address] [place], [place]  [address] Phone - [phone]   Fax - [phone] [facility] [address] [place], [place]  [address] Phone - [phone]   Fax - [phone]  [facility] [address] [place], [place]  [address] Phone - [phone]   Fax - [phone]   [facility] [address] [place], [place]  [address] Phone - [phone]   Fax - [phone]   

## 2016-05-30 NOTE — Progress Notes (Signed)
Fasting PPB PPL PPD  76-103, 3/7 abnl 102-109, 0 abnl 60-133, 4/6 abnl 94-178, 3/7 abnl     PRENATAL VISIT NOTE  Subjective:  [name] is a 35 y.o. G1P0000 at 2099w4d being seen today for ongoing prenatal care.  She is currently monitored for the following issues for this high-risk pregnancy and has ALLERGIC RHINITIS; Supervision of high risk pregnancy, antepartum; Gestational diabetes mellitus (GDM) affecting pregnancy; and Paroxysmal SVT (supraventricular tachycardia) (HCC) on her problem list.  Patient reports occasional contractions.  Contractions: Irregular. Vag. Bleeding: None.  Movement: Present. Denies leaking of fluid.   The following portions of the patient's history were reviewed and updated as appropriate: allergies, current medications, past family history, past medical history, past social history, past surgical history and problem list. Problem list updated.  Objective:   Vitals:   05/30/16 0905  BP: 139/86  Pulse: 85  Weight: 175 lb 4.8 oz (79.5 kg)    Fetal Status: Fetal Heart Rate (bpm): NST   Movement: Present     General:  Alert, oriented and cooperative. Patient is in no acute distress.  Skin: Skin is warm and dry. No rash noted.   Cardiovascular: Normal heart rate noted  Respiratory: Normal respiratory effort, no problems with respiration noted  Abdomen: Soft, gravid, appropriate for gestational age. Pain/Pressure: Present     Pelvic:  Cervical exam deferred        Extremities: Normal range of motion.  Edema: Trace  Mental Status: Normal mood and affect. Normal behavior. Normal judgment and thought content.   Urinalysis:    Protein negative Glucose negative  Assessment and Plan:  Pregnancy: G1P0000 at 2399w4d  1. Gestational diabetes mellitus (GDM) affecting pregnancy - Fetal nonstress test > NST reactive - Increase AM Glyburide to 6.25   2. Supervision of high risk pregnancy, antepartum, third trimester - Reviewed GBS results  Term labor  symptoms and general obstetric precautions including but not limited to vaginal bleeding, contractions, leaking of fluid and fetal movement were reviewed in detail with the patient. Please refer to After Visit Summary for other counseling recommendations.  Return in about 3 days (around 06/02/2016) for NST/AFI in am.  Marlis EdelsonWalidah N Karim, CNM

## 2016-06-02 ENCOUNTER — Ambulatory Visit (INDEPENDENT_AMBULATORY_CARE_PROVIDER_SITE_OTHER): Payer: BLUE CROSS/BLUE SHIELD | Admitting: *Deleted

## 2016-06-02 ENCOUNTER — Other Ambulatory Visit: Payer: BLUE CROSS/BLUE SHIELD

## 2016-06-02 VITALS — BP 129/87 | HR 79

## 2016-06-02 DIAGNOSIS — O24419 Gestational diabetes mellitus in pregnancy, unspecified control: Secondary | ICD-10-CM

## 2016-06-02 DIAGNOSIS — Z36 Encounter for antenatal screening of mother: Secondary | ICD-10-CM

## 2016-06-02 LAB — POCT URINALYSIS DIP (DEVICE)
BILIRUBIN URINE: NEGATIVE
GLUCOSE, UA: NEGATIVE mg/dL
Hgb urine dipstick: NEGATIVE
Ketones, ur: NEGATIVE mg/dL
LEUKOCYTES UA: NEGATIVE
NITRITE: NEGATIVE
Protein, ur: NEGATIVE mg/dL
Specific Gravity, Urine: <=1.005 (ref 1.005–1.030)
UROBILINOGEN UA: 0.2 mg/dL (ref 0.0–1.0)
pH: 6 (ref 5.0–8.0)

## 2016-06-02 NOTE — Progress Notes (Signed)
Pt reports having H/A this morning - pain scale - 5.  She also was seeing "lights" for about 30 minutes and reports BP @ home was 128/96.  She took Tylenol for the H/A and now has no H/A.  She reports CBG's have improved since Glyburide was increased 3 days ago.  Report given to Dr. Vergie LivingPickens - recommends pt to check CBG if she has any of the same reported sx from earlier today to be sure it is not related to blood sugar. She may go home and return if she develops sx of pre-eclampsia. Pt voiced understanding of all information and instructions.

## 2016-06-02 NOTE — Progress Notes (Signed)
NST Note Date: 06/02/2016 Gestational Age: 36/0 FHT: 155 baseline, +accels, no decel, mod variability Toco: one UC  A/P: rNST. Continue with current plan of care.   Cornelia Copaharlie Tiffny Gemmer, Jr MD Attending Center for [facility] Mid[facility](Faculty Practice)

## 2016-06-06 ENCOUNTER — Ambulatory Visit (INDEPENDENT_AMBULATORY_CARE_PROVIDER_SITE_OTHER): Payer: BLUE CROSS/BLUE SHIELD | Admitting: Advanced Practice Midwife

## 2016-06-06 ENCOUNTER — Inpatient Hospital Stay (HOSPITAL_COMMUNITY)
Admission: AD | Admit: 2016-06-06 | Discharge: 2016-06-09 | DRG: 775 | Disposition: A | Discharge status: Home / Self Care | Payer: BLUE CROSS/BLUE SHIELD | Source: Ambulatory Visit | Attending: Family Medicine | Admitting: Family Medicine

## 2016-06-06 VITALS — BP 140/91 | HR 77 | Wt 177.3 lb

## 2016-06-06 DIAGNOSIS — O288 Other abnormal findings on antenatal screening of mother: Secondary | ICD-10-CM

## 2016-06-06 DIAGNOSIS — O99214 Obesity complicating childbirth: Secondary | ICD-10-CM | POA: Diagnosis present

## 2016-06-06 DIAGNOSIS — O0993 Supervision of high risk pregnancy, unspecified, third trimester: Secondary | ICD-10-CM

## 2016-06-06 DIAGNOSIS — O24419 Gestational diabetes mellitus in pregnancy, unspecified control: Secondary | ICD-10-CM

## 2016-06-06 DIAGNOSIS — O134 Gestational [pregnancy-induced] hypertension without significant proteinuria, complicating childbirth: Secondary | ICD-10-CM | POA: Diagnosis present

## 2016-06-06 DIAGNOSIS — Z6832 Body mass index (BMI) 32.0-32.9, adult: Secondary | ICD-10-CM

## 2016-06-06 DIAGNOSIS — O133 Gestational [pregnancy-induced] hypertension without significant proteinuria, third trimester: Secondary | ICD-10-CM | POA: Diagnosis present

## 2016-06-06 DIAGNOSIS — Z3A38 38 weeks gestation of pregnancy: Secondary | ICD-10-CM

## 2016-06-06 DIAGNOSIS — Z8632 Personal history of gestational diabetes: Secondary | ICD-10-CM

## 2016-06-06 DIAGNOSIS — Z8249 Family history of ischemic heart disease and other diseases of the circulatory system: Secondary | ICD-10-CM | POA: Diagnosis not present

## 2016-06-06 DIAGNOSIS — E669 Obesity, unspecified: Secondary | ICD-10-CM | POA: Diagnosis present

## 2016-06-06 DIAGNOSIS — O24425 Gestational diabetes mellitus in childbirth, controlled by oral hypoglycemic drugs: Secondary | ICD-10-CM | POA: Diagnosis present

## 2016-06-06 LAB — ABO/RH: ABO/RH(D): A POS

## 2016-06-06 LAB — POCT URINALYSIS DIP (DEVICE)
Bilirubin Urine: NEGATIVE
GLUCOSE, UA: NEGATIVE mg/dL
Hgb urine dipstick: NEGATIVE
Ketones, ur: NEGATIVE mg/dL
Leukocytes, UA: NEGATIVE
Nitrite: NEGATIVE
PROTEIN: NEGATIVE mg/dL
Specific Gravity, Urine: 1.02 (ref 1.005–1.030)
UROBILINOGEN UA: 0.2 mg/dL (ref 0.0–1.0)
pH: 7 (ref 5.0–8.0)

## 2016-06-06 LAB — COMPREHENSIVE METABOLIC PANEL
ALBUMIN: 3 g/dL — AB (ref 3.5–5.0)
ALK PHOS: 127 U/L — AB (ref 38–126)
ALT: 15 U/L (ref 14–54)
AST: 25 U/L (ref 15–41)
Anion gap: 8 (ref 5–15)
BUN: 13 mg/dL (ref 6–20)
CALCIUM: 9.3 mg/dL (ref 8.9–10.3)
CO2: 18 mmol/L — AB (ref 22–32)
CREATININE: 0.58 mg/dL (ref 0.44–1.00)
Chloride: 107 mmol/L (ref 101–111)
GFR calc non Af Amer: >60 mL/min (ref >60)
GLUCOSE: 86 mg/dL (ref 65–99)
Potassium: 4.6 mmol/L (ref 3.5–5.1)
SODIUM: 133 mmol/L — AB (ref 135–145)
TOTAL PROTEIN: 6.5 g/dL (ref 6.5–8.1)
Total Bilirubin: 0.3 mg/dL (ref 0.3–1.2)

## 2016-06-06 LAB — CBC
HCT: 36.8 % (ref 36.0–46.0)
Hemoglobin: 12.8 g/dL (ref 12.0–15.0)
MCH: 28.8 pg (ref 26.0–34.0)
MCHC: 34.8 g/dL (ref 30.0–36.0)
MCV: 82.7 fL (ref 78.0–100.0)
PLATELETS: 281 10*3/uL (ref 150–400)
RBC: 4.45 MIL/uL (ref 3.87–5.11)
RDW: 15.1 % (ref 11.5–15.5)
WBC: 7.7 10*3/uL (ref 4.0–10.5)

## 2016-06-06 LAB — TYPE AND SCREEN
ABO/RH(D): A POS
Antibody Screen: NEGATIVE

## 2016-06-06 LAB — GLUCOSE, CAPILLARY
GLUCOSE-CAPILLARY: 88 mg/dL (ref 65–99)
Glucose-Capillary: 105 mg/dL — ABNORMAL HIGH (ref 65–99)
Glucose-Capillary: 95 mg/dL (ref 65–99)

## 2016-06-06 LAB — PROTEIN / CREATININE RATIO, URINE
Creatinine, Urine: 133 mg/dL
PROTEIN CREATININE RATIO: 0.38 mg/mg{creat} — AB (ref 0.00–0.15)
Total Protein, Urine: 51 mg/dL

## 2016-06-06 MED ORDER — PHENYLEPHRINE 40 MCG/ML (10ML) SYRINGE FOR IV PUSH (FOR BLOOD PRESSURE SUPPORT)
80.0000 ug | PREFILLED_SYRINGE | INTRAVENOUS | Status: DC | PRN
  Filled 2016-06-06: qty 5

## 2016-06-06 MED ORDER — ONDANSETRON HCL 4 MG/2ML IJ SOLN
4.0000 mg | Freq: Four times a day (QID) | INTRAMUSCULAR | Status: DC | PRN
  Administered 2016-06-06 – 2016-06-07 (×4): 4 mg via INTRAVENOUS
  Filled 2016-06-06 (×4): qty 2

## 2016-06-06 MED ORDER — OXYTOCIN 40 UNITS IN LACTATED RINGERS INFUSION - SIMPLE MED: 2.5000 [IU]/h | INTRAVENOUS | Status: DC

## 2016-06-06 MED ORDER — MISOPROSTOL 25 MCG QUARTER TABLET
25.0000 ug | ORAL_TABLET | ORAL | Status: DC | PRN
  Administered 2016-06-06 (×2): 25 ug via VAGINAL
  Filled 2016-06-06 (×2): qty 0.25
  Filled 2016-06-06: qty 1
  Filled 2016-06-06: qty 0.25

## 2016-06-06 MED ORDER — LIDOCAINE HCL (PF) 1 % IJ SOLN
30.0000 mL | INTRAMUSCULAR | Status: AC | PRN
  Administered 2016-06-07: 30 mL via SUBCUTANEOUS
  Filled 2016-06-06: qty 30

## 2016-06-06 MED ORDER — ACETAMINOPHEN 325 MG PO TABS: 650.0000 mg | ORAL_TABLET | ORAL | Status: DC | PRN

## 2016-06-06 MED ORDER — TERBUTALINE SULFATE 1 MG/ML IJ SOLN: 0.2500 mg | Freq: Once | INTRAMUSCULAR | Status: DC | PRN

## 2016-06-06 MED ORDER — OXYTOCIN BOLUS FROM INFUSION
500.0000 mL | Freq: Once | INTRAVENOUS | Status: AC
  Administered 2016-06-07: 500 mL/h via INTRAVENOUS

## 2016-06-06 MED ORDER — FENTANYL 2.5 MCG/ML BUPIVACAINE 1/10 % EPIDURAL INFUSION (WH - ANES)
14.0000 mL/h | INTRAMUSCULAR | Status: DC | PRN
  Administered 2016-06-07: 14 mL/h via EPIDURAL
  Administered 2016-06-07: 12 mL/h via EPIDURAL
  Filled 2016-06-06 (×2): qty 125

## 2016-06-06 MED ORDER — LACTATED RINGERS IV SOLN
500.0000 mL | INTRAVENOUS | Status: DC | PRN
Start: 2016-06-06 — End: 2016-06-07
  Administered 2016-06-07: 250 mL via INTRAVENOUS

## 2016-06-06 MED ORDER — EPHEDRINE 5 MG/ML INJ
10.0000 mg | INTRAVENOUS | Status: DC | PRN
  Filled 2016-06-06: qty 4

## 2016-06-06 MED ORDER — FLEET ENEMA 7-19 GM/118ML RE ENEM: 1.0000 | ENEMA | RECTAL | Status: DC | PRN

## 2016-06-06 MED ORDER — FENTANYL CITRATE (PF) 100 MCG/2ML IJ SOLN
50.0000 ug | INTRAMUSCULAR | Status: DC | PRN
  Administered 2016-06-06 – 2016-06-07 (×6): 50 ug via INTRAVENOUS
  Filled 2016-06-06 (×5): qty 2

## 2016-06-06 MED ORDER — PHENYLEPHRINE 40 MCG/ML (10ML) SYRINGE FOR IV PUSH (FOR BLOOD PRESSURE SUPPORT)
80.0000 ug | PREFILLED_SYRINGE | INTRAVENOUS | Status: DC | PRN
  Filled 2016-06-06: qty 10
  Filled 2016-06-06: qty 5

## 2016-06-06 MED ORDER — SOD CITRATE-CITRIC ACID 500-334 MG/5ML PO SOLN
30.0000 mL | ORAL | Status: DC | PRN
  Filled 2016-06-06: qty 15

## 2016-06-06 MED ORDER — LACTATED RINGERS IV SOLN
INTRAVENOUS | Status: DC
  Administered 2016-06-06 (×2): 125 mL/h via INTRAVENOUS
  Administered 2016-06-07: 06:00:00 via INTRAVENOUS

## 2016-06-06 MED ORDER — DIPHENHYDRAMINE HCL 50 MG/ML IJ SOLN: 12.5000 mg | INTRAMUSCULAR | Status: DC | PRN

## 2016-06-06 MED ORDER — LACTATED RINGERS IV SOLN: 500.0000 mL | Freq: Once | INTRAVENOUS | Status: DC

## 2016-06-06 NOTE — Progress Notes (Signed)
Pt reports small amount of clear fluid leaking x3 days - unsure if urine.

## 2016-06-06 NOTE — Anesthesia Pain Management Evaluation Note (Signed)
  CRNA Pain Management Visit Note  Patient: Lisa MoosKristine G Suh, 35 y.o., female  "Hello I am a [facility] of the [facility]. We have an anesthesia team available at all times to provide care throughout the hospital, including epidural management and anesthesia for C-section. I don't know your plan for the delivery whether it a natural birth, water birth, IV sedation, nitrous supplementation, doula or epidural, but we want to meet your pain goals."   1.Was your pain managed to your expectations on prior hospitalizations?   No prior hospitalizations  2.What is your expectation for pain management during this hospitalization?     Epidural  3.How can we help you reach that goal? Epidural, when ready.  Record the patient's initial score and the patient's pain goal.   Pain: 2  Pain Goal: 4 [facility] wants you to be able to say your pain was always managed very well.  Kayveon Lennartz L 06/06/2016

## 2016-06-06 NOTE — H&P (Signed)
LABOR AND DELIVERY ADMISSION HISTORY AND PHYSICAL NOTE  [name] is a 35 y.o. female G1P0000 with IUP at 8879w4d by 8-wk U/S, and pregnancy complicated by GDM, class A2, GHTN, and episode of SVT this pregnancy, presenting for IOL for non-reactive NST. She reports feeling occasional contractions since around 9am this morning.  She reports positive fetal movement. She denies leakage of fluid or vaginal bleeding. Also denies any headache, visual changes, epigastric/RUQ pain, or SOB.   Prenatal History/Complications: Clinic  [facility] Prenatal Labs  Dating  8 week sono Blood type: A/Positive/-- (03/14 0000)   Genetic Screen  NIPS: low risk Antibody:Negative (03/14 0000)  Anatomic US  Normal Rubella: Immune (03/14 0000)  GTT Third trimester: 2 abnormal values RPR: Nonreactive (03/14 0000)   Flu vaccine  05/30/16 HBsAg: Negative (03/14 0000)   TDaP vaccine 03/11/16             HIV: Non-reactive (03/14 0000)   Baby Food Breast                                           GBS: Negative  Contraception  Pap: Normal (10/2014)  Circumcision n/a   Pediatrician  Given list   Support Person  Mother, Fiance    - GDM, class A2, on glyburide - Gestational HTN - pSVT  Past Medical History: Past Medical History:  Diagnosis Date  . PCOS (polycystic ovarian syndrome)     Past Surgical History: Past Surgical History:  Procedure Laterality Date  . APPENDECTOMY      Obstetrical History: OB History    Gravida Para Term Preterm AB Living   1 0 0 0 0 0   SAB TAB Ectopic Multiple Live Births   0 0 0 0 0      Social History: Social History   Social History  . Marital status: Single    Spouse name: N/A  . Number of children: N/A  . Years of education: N/A   Social History Main Topics  . Smoking status: Never Smoker  . Smokeless tobacco: Never Used  . Alcohol use No  . Drug use: No  . Sexual activity: Not on file   Other Topics Concern  . Not on file   Social History Narrative  . No  narrative on file    Family History: Family History  Problem Relation Age of Onset  . Hypertension Mother   . Hypertension Father   . Cancer Maternal Grandmother     Allergies: Allergies  Allergen Reactions  . Codeine     hallucinates    Prescriptions Prior to Admission  Medication Sig Dispense Refill Last Dose  . doxylamine, Sleep, (UNISOM) 25 MG tablet Take 25 mg by mouth at bedtime as needed.   06/05/2016 at Unknown time  . glyBURIDE (DIABETA) 2.5 MG tablet Take 2.5 tablets (6.25 mg total) by mouth 2 (two) times daily. Take 6.25 mg daily with breakfast and 2.5 mg daily at bedtime 60 tablet 1 06/05/2016 at Unknown time  . Polyethylene Glycol 3350 (MIRALAX PO) Take 17 g by mouth daily.   Past Month at Unknown time  . Prenatal Vit-Fe Fumarate-FA (MULTIVITAMIN-PRENATAL) 27-0.8 MG TABS tablet Take 1 tablet by mouth daily at 12 noon.    06/05/2016 at Unknown time  . glucose blood test strip Use as instructed 100 each 12 Taking     Review of Systems  All systems reviewed and negative except as stated in HPI  Physical Exam Blood pressure (!) 143/101, pulse 67, temperature 98.3 F (36.8 C), temperature source Oral, resp. rate 18, height 5\' 2"  (1.575 m), weight 177 lb (80.3 kg), last menstrual period 09/01/2015. General appearance: alert and cooperative Lungs: normal respiratory effors  Heart: regular rate Abdomen: soft, non-tender Extremities: No calf swelling or tenderness Presentation: cephalic Fetal monitoring: baseline rate 150, moderate variability, +acel, no devel Uterine activity: irregular ctx    Prenatal labs: ABO, Rh: --/--/A POS (09/26 1050) Antibody: NEG (09/26 1050) Rubella: Immune RPR: Nonreactive (03/14 0000)  HBsAg: Negative (03/14 0000)  HIV: Non-reactive (03/14 0000)  GBS: Negative (09/06 0000)  2 hr GTT: 97, 240, 153 -->GDM Genetic screening:  NIPS, low risk Anatomy US: normal  Prenatal Transfer Tool  Maternal Diabetes: Yes:  Diabetes Type:   Insulin/Medication controlled Genetic Screening: Normal Maternal Ultrasounds/Referrals: Normal Fetal Ultrasounds or other Referrals:  None Maternal Substance Abuse:  No Significant Maternal Medications:  Meds include: Other: glyburide Significant Maternal Lab Results: Lab values include: Group B Strep negative  Results for orders placed or performed during the hospital encounter of 06/06/16 (from the past 24 hour(s))  CBC   Collection Time: 06/06/16 10:50 AM  Result Value Ref Range   WBC 7.7 4.0 - 10.5 K/uL   RBC 4.45 3.87 - 5.11 MIL/uL   Hemoglobin 12.8 12.0 - 15.0 g/dL   HCT 78.2 95.6 - 21.3 %   MCV 82.7 78.0 - 100.0 fL   MCH 28.8 26.0 - 34.0 pg   MCHC 34.8 30.0 - 36.0 g/dL   RDW 08.6 57.8 - 46.9 %   Platelets 281 150 - 400 K/uL  Comprehensive metabolic panel   Collection Time: 06/06/16 10:50 AM  Result Value Ref Range   Sodium 133 (L) 135 - 145 mmol/L   Potassium 4.6 3.5 - 5.1 mmol/L   Chloride 107 101 - 111 mmol/L   CO2 18 (L) 22 - 32 mmol/L   Glucose, Bld 86 65 - 99 mg/dL   BUN 13 6 - 20 mg/dL   Creatinine, Ser 6.29 0.44 - 1.00 mg/dL   Calcium 9.3 8.9 - 52.8 mg/dL   Total Protein 6.5 6.5 - 8.1 g/dL   Albumin 3.0 (L) 3.5 - 5.0 g/dL   AST 25 15 - 41 U/L   ALT 15 14 - 54 U/L   Alkaline Phosphatase 127 (H) 38 - 126 U/L   Total Bilirubin PENDING 0.3 - 1.2 mg/dL   GFR calc non Af Amer >60 >60 mL/min   GFR calc Af Amer >60 >60 mL/min   Anion gap 8 5 - 15  Type and screen [facility]   Collection Time: 06/06/16 10:50 AM  Result Value Ref Range   ABO/RH(D) A POS    Antibody Screen NEG    Sample Expiration 06/09/2016   Glucose, capillary   Collection Time: 06/06/16  1:02 PM  Result Value Ref Range   Glucose-Capillary 105 (H) 65 - 99 mg/dL  Results for orders placed or performed in visit on 06/06/16 (from the past 24 hour(s))  POCT urinalysis dip (device)   Collection Time: 06/06/16  9:01 AM  Result Value Ref Range   Glucose, UA NEGATIVE NEGATIVE  mg/dL   Bilirubin Urine NEGATIVE NEGATIVE   Ketones, ur NEGATIVE NEGATIVE mg/dL   Specific Gravity, Urine 1.020 1.005 - 1.030   Hgb urine dipstick NEGATIVE NEGATIVE   pH 7.0 5.0 - 8.0   Protein, ur  NEGATIVE NEGATIVE mg/dL   Urobilinogen, UA 0.2 0.0 - 1.0 mg/dL   Nitrite NEGATIVE NEGATIVE   Leukocytes, UA NEGATIVE NEGATIVE    Patient Active Problem List   Diagnosis Date Noted  . Non-reactive NST (non-stress test) 06/06/2016  . Gestational hypertension w/o significant proteinuria in 3rd trimester 06/06/2016  . GDM, class A2 06/06/2016  . Supervision of high risk pregnancy, antepartum 05/05/2016  . Gestational diabetes mellitus (GDM) affecting pregnancy 05/05/2016  . Paroxysmal SVT (supraventricular tachycardia) (HCC) 05/05/2016  . ALLERGIC RHINITIS 02/26/2007    Assessment: [name] is a 35 y.o. G1P0000 with IUP at [redacted]w[redacted]d, and pregnancy complicated by GDM and GHTN here for IOL for nonreaactive NST.  #Labor: cervical ripening with FB  #Pain: IV pain meds. May have epidural when in active labor #FWB: Cat I #ID:  GBS negative #MOF: breast #GHTN: CBC and CMP wnl. Urine P/Cr pending #GDM: CBG q4h  [name] 06/06/2016, 3:03 PM  Midwife attestation: I have seen and examined this patient; I agree with above documentation in the resident's note.   [name] is a 35 y.o. G1P0000 here for IOL for NRNST  PE: BP (!) 150/93 (BP Location: Left Arm)   Pulse 60   Temp 98.3 F (36.8 C) (Oral)   Resp 16   Ht 5\' 2"  (1.575 m)   Wt 80.3 kg (177 lb)   LMP 09/01/2015 (Exact Date)   BMI 32.37 kg/m  Gen: calm comfortable, NAD Resp: normal effort, no distress Abd: gravid  ROS, labs, PMH reviewed  A/Plan: 38.[redacted] weeks Gestational HTN>pre-e labs pending A2GDM Admit to LD Labor: ripening with Cytotec and foley FWB: Cat I ID: [id number] neg  [name], CNM  06/06/2016, 7:01 PM

## 2016-06-06 NOTE — Progress Notes (Signed)
LABOR PROGRESS NOTE  [name] is a 35 y.o. G1P0000 at [redacted]w[redacted]d  admitted for IOL for GHTN with nonreactive NST, now pre-eclampsia w/o severe features  Subjective: Pt doing well. Reports cramping with moderate control with IV fentanyl.  Objective: BP (!) 152/99 (BP Location: Left Arm)   Pulse 63   Temp 97.7 F (36.5 C) (Oral)   Resp 16   Ht 5\' 2"  (1.575 m)   Wt 177 lb (80.3 kg)   LMP 09/01/2015 (Exact Date)   BMI 32.37 kg/m  or  Vitals:   06/06/16 1803 06/06/16 1835 06/06/16 1902 06/06/16 2006  BP:  (!) 150/93 (!) 138/97 (!) 152/99  Pulse:  60 74 63  Resp: 16 16 16 16   Temp:    97.7 F (36.5 C)  TempSrc:    Oral  Weight:      Height:        NAD Dilation: 1.5 Effacement (%): 80 Cervical Position: Posterior Station: -3 Presentation: Vertex Exam by:: Nolene Ebbsegele, Khali Perella MD FHT: baseline rate 155, moderate varibility, +acel, no decel Toco: every 1- 6min  Labs: Lab Results  Component Value Date   WBC 7.7 06/06/2016   HGB 12.8 06/06/2016   HCT 36.8 06/06/2016   MCV 82.7 06/06/2016   PLT 281 06/06/2016    Patient Active Problem List   Diagnosis Date Noted  . Non-reactive NST (non-stress test) 06/06/2016  . Gestational hypertension w/o significant proteinuria in 3rd trimester 06/06/2016  . GDM, class A2 06/06/2016  . Supervision of high risk pregnancy, antepartum 05/05/2016  . Gestational diabetes mellitus (GDM) affecting pregnancy 05/05/2016  . Paroxysmal SVT (supraventricular tachycardia) (HCC) 05/05/2016  . ALLERGIC RHINITIS 02/26/2007    Assessment / Plan: 35 y.o. G1P0000 at 648w4d here for IOL for gestational HTN with NRNST, now PreE w/o severe features  Labor: cervical ripening with FB and cytotec. Cytotec #2 placed at 20:00 Fetal Wellbeing:  Category I Pain Control:  IV Fentanyl. May have epidural per pt request Anticipated MOD:  SVD PreE: Asymptomatic, BPs mild range GDM: CBG q4h  Frederik PearJulie P Zale Marcotte, MD 06/06/2016, 8:15 PM

## 2016-06-07 ENCOUNTER — Inpatient Hospital Stay (HOSPITAL_COMMUNITY): Payer: BLUE CROSS/BLUE SHIELD | Admitting: Anesthesiology

## 2016-06-07 ENCOUNTER — Encounter (HOSPITAL_COMMUNITY): Payer: Self-pay | Admitting: Anesthesiology

## 2016-06-07 DIAGNOSIS — O24425 Gestational diabetes mellitus in childbirth, controlled by oral hypoglycemic drugs: Secondary | ICD-10-CM

## 2016-06-07 DIAGNOSIS — Z3A38 38 weeks gestation of pregnancy: Secondary | ICD-10-CM

## 2016-06-07 LAB — CBC
HEMATOCRIT: 33.7 % — AB (ref 36.0–46.0)
HEMATOCRIT: 38.1 % (ref 36.0–46.0)
HEMOGLOBIN: 13 g/dL (ref 12.0–15.0)
Hemoglobin: 11.5 g/dL — ABNORMAL LOW (ref 12.0–15.0)
MCH: 28.6 pg (ref 26.0–34.0)
MCH: 28.6 pg (ref 26.0–34.0)
MCHC: 34.1 g/dL (ref 30.0–36.0)
MCHC: 34.1 g/dL (ref 30.0–36.0)
MCV: 83.8 fL (ref 78.0–100.0)
MCV: 83.9 fL (ref 78.0–100.0)
Platelets: 225 10*3/uL (ref 150–400)
Platelets: 269 10*3/uL (ref 150–400)
RBC: 4.02 MIL/uL (ref 3.87–5.11)
RBC: 4.54 MIL/uL (ref 3.87–5.11)
RDW: 15 % (ref 11.5–15.5)
RDW: 15.3 % (ref 11.5–15.5)
WBC: 10.4 10*3/uL (ref 4.0–10.5)
WBC: 14.7 10*3/uL — AB (ref 4.0–10.5)

## 2016-06-07 LAB — GLUCOSE, CAPILLARY
GLUCOSE-CAPILLARY: 96 mg/dL (ref 65–99)
Glucose-Capillary: 107 mg/dL — ABNORMAL HIGH (ref 65–99)
Glucose-Capillary: 94 mg/dL (ref 65–99)

## 2016-06-07 LAB — RPR: RPR: NONREACTIVE

## 2016-06-07 MED ORDER — DOCUSATE SODIUM 100 MG PO CAPS
100.0000 mg | ORAL_CAPSULE | Freq: Two times a day (BID) | ORAL | Status: DC
  Administered 2016-06-07 – 2016-06-09 (×4): 100 mg via ORAL
  Filled 2016-06-07 (×4): qty 1

## 2016-06-07 MED ORDER — WITCH HAZEL-GLYCERIN EX PADS: 1.0000 "application " | MEDICATED_PAD | CUTANEOUS | Status: DC | PRN

## 2016-06-07 MED ORDER — ACETAMINOPHEN 325 MG PO TABS
650.0000 mg | ORAL_TABLET | ORAL | Status: DC | PRN
  Administered 2016-06-07 – 2016-06-09 (×5): 650 mg via ORAL
  Filled 2016-06-07 (×5): qty 2

## 2016-06-07 MED ORDER — DIPHENHYDRAMINE HCL 25 MG PO CAPS: 25.0000 mg | ORAL_CAPSULE | Freq: Four times a day (QID) | ORAL | Status: DC | PRN

## 2016-06-07 MED ORDER — ONDANSETRON HCL 4 MG PO TABS
4.0000 mg | ORAL_TABLET | ORAL | Status: DC | PRN
  Administered 2016-06-07: 4 mg via ORAL
  Filled 2016-06-07: qty 1

## 2016-06-07 MED ORDER — COCONUT OIL OIL
1.0000 "application " | TOPICAL_OIL | Status: DC | PRN
  Administered 2016-06-07: 1 via TOPICAL
  Filled 2016-06-07: qty 120

## 2016-06-07 MED ORDER — OXYTOCIN 40 UNITS IN LACTATED RINGERS INFUSION - SIMPLE MED
1.0000 m[IU]/min | INTRAVENOUS | Status: DC
  Administered 2016-06-07: 2 m[IU]/min via INTRAVENOUS
  Filled 2016-06-07: qty 1000

## 2016-06-07 MED ORDER — IBUPROFEN 600 MG PO TABS
600.0000 mg | ORAL_TABLET | Freq: Four times a day (QID) | ORAL | Status: DC
  Administered 2016-06-08 – 2016-06-09 (×7): 600 mg via ORAL
  Filled 2016-06-07 (×7): qty 1

## 2016-06-07 MED ORDER — ONDANSETRON HCL 4 MG/2ML IJ SOLN: 4.0000 mg | INTRAMUSCULAR | Status: DC | PRN

## 2016-06-07 MED ORDER — OXYCODONE-ACETAMINOPHEN 5-325 MG PO TABS: 1.0000 | ORAL_TABLET | ORAL | Status: DC | PRN

## 2016-06-07 MED ORDER — DIBUCAINE 1 % RE OINT: 1.0000 "application " | TOPICAL_OINTMENT | RECTAL | Status: DC | PRN

## 2016-06-07 MED ORDER — OXYCODONE-ACETAMINOPHEN 5-325 MG PO TABS: 2.0000 | ORAL_TABLET | ORAL | Status: DC | PRN

## 2016-06-07 MED ORDER — OXYTOCIN 40 UNITS IN LACTATED RINGERS INFUSION - SIMPLE MED: 1.0000 m[IU]/min | INTRAVENOUS | Status: DC

## 2016-06-07 MED ORDER — PRENATAL MULTIVITAMIN CH
1.0000 | ORAL_TABLET | Freq: Every day | ORAL | Status: DC
  Administered 2016-06-08 – 2016-06-09 (×2): 1 via ORAL
  Filled 2016-06-07 (×2): qty 1

## 2016-06-07 MED ORDER — LIDOCAINE HCL (PF) 1 % IJ SOLN
INTRAMUSCULAR | Status: DC | PRN
  Administered 2016-06-07: 3 mL via EPIDURAL
  Administered 2016-06-07: 4 mL via EPIDURAL

## 2016-06-07 MED ORDER — BENZOCAINE-MENTHOL 20-0.5 % EX AERO
1.0000 "application " | INHALATION_SPRAY | CUTANEOUS | Status: DC | PRN
  Administered 2016-06-07: 1 via TOPICAL
  Filled 2016-06-07: qty 56

## 2016-06-07 MED ORDER — SIMETHICONE 80 MG PO CHEW
80.0000 mg | CHEWABLE_TABLET | ORAL | Status: DC | PRN
  Administered 2016-06-08: 80 mg via ORAL
  Filled 2016-06-07: qty 1

## 2016-06-07 NOTE — Progress Notes (Signed)
[name] is a 35 y.o. G1P0000 at 839w5d by 8 week ultrasound admitted for induction of labor due to NRNST, GHTN vs preeclampsia, GDM.  Subjective: Pt comfortable with epidural, reports nausea despite IV Zofran.  Family in room for support.  Objective: BP 131/73   Pulse 64   Temp 98.9 F (37.2 C) (Oral)   Resp 18   Ht 5\' 2"  (1.575 m)   Wt 80.3 kg (177 lb)   LMP 09/01/2015 (Exact Date)   BMI 32.37 kg/m  I/O last 3 completed shifts: In: 1019.8 [I.V.:1019.8] Out: -  No intake/output data recorded.  FHT:  FHR: 155 bpm, variability: moderate,  accelerations:  Present,  decelerations:  Absent UC:   irregular, every 2-4 minutes SVE:   Dilation: Lip/rim Effacement (%): 90 Station: 0 Exam by:: Lisa ResourcesLisa Leftwitch kirby Stanley  Labs: Lab Results  Component Value Date   WBC 10.4 06/07/2016   HGB 13.0 06/07/2016   HCT 38.1 06/07/2016   MCV 83.9 06/07/2016   PLT 269 06/07/2016    Assessment / Plan: Induction of labor due to NRNST, GHTN, GDM,  progressing well on pitocin  Labor: Progressing normally Preeclampsia:  labs stable Fetal Wellbeing:  Category I Pain Control:  Epidural I/D:  GBS neg Anticipated MOD:  NSVD  [name] 06/07/2016, 10:53 AM

## 2016-06-07 NOTE — Anesthesia Preprocedure Evaluation (Signed)
Anesthesia Evaluation  Patient identified by MRN, date of birth, ID band Patient awake    Reviewed: Allergy & Precautions, NPO status , Patient's Chart, lab work & pertinent test results  Airway Mallampati: III       Dental no notable dental hx. (+) Teeth Intact   Pulmonary neg pulmonary ROS,    Pulmonary exam normal breath sounds clear to auscultation       Cardiovascular hypertension, Normal cardiovascular exam Rhythm:Regular Rate:Normal     Neuro/Psych negative neurological ROS     GI/Hepatic negative GI ROS, Neg liver ROS,   Endo/Other  diabetes, Well Controlled, GestationalObesity PCOS  Renal/GU negative Renal ROS  negative genitourinary   Musculoskeletal negative musculoskeletal ROS (+)   Abdominal (+) + obese,   Peds  Hematology negative hematology ROS (+)   Anesthesia Other Findings   Reproductive/Obstetrics (+) Pregnancy Gestational HTN                             Lab Results  Component Value Date   WBC 7.7 06/06/2016   HGB 12.8 06/06/2016   HCT 36.8 06/06/2016   MCV 82.7 06/06/2016   PLT 281 06/06/2016     Chemistry      Component Value Date/Time   NA 133 (L) 06/06/2016 1050   K 4.6 06/06/2016 1050   CL 107 06/06/2016 1050   CO2 18 (L) 06/06/2016 1050   BUN 13 06/06/2016 1050   CREATININE 0.58 06/06/2016 1050   CREATININE 0.58 05/23/2016 0001      Component Value Date/Time   CALCIUM 9.3 06/06/2016 1050   ALKPHOS 127 (H) 06/06/2016 1050   AST 25 06/06/2016 1050   ALT 15 06/06/2016 1050   BILITOT 0.3 06/06/2016 1050      Anesthesia Physical Anesthesia Plan  ASA: II  Anesthesia Plan: Epidural   Post-op Pain Management:    Induction:   Airway Management Planned: Natural Airway  Additional Equipment:   Intra-op Plan:   Post-operative Plan:   Informed Consent: I have reviewed the patients History and Physical, chart, labs and discussed the  procedure including the risks, benefits and alternatives for the proposed anesthesia with the patient or authorized representative who has indicated his/her understanding and acceptance.     Plan Discussed with: Anesthesiologist  Anesthesia Plan Comments:         Anesthesia Quick Evaluation

## 2016-06-07 NOTE — Progress Notes (Addendum)
Evaluated pt. Patient has had some periods of decreased variability. Has had an irregular contraction pattern. Is on 2 milliunits of pitocin. IUPC placed and Pt examined. 6-7cm dilated at this time. Pt blood glucose has been approximately 100 through labor. Continue to monitor. Category 2 tracing at this time, going to attempt repositioning.

## 2016-06-07 NOTE — Lactation Note (Signed)
This note was copied from a baby's chart. Lactation Consultation Note  Patient Name: Lisa Mickeal NeedyKristine Norlander UJWJX'BToday's Date: 06/07/2016 Reason for consult: Initial assessment   Initial assessment with [facility]. Mom with history of PCOS, GDM, and maternal temp at delivery.   Infant was positioned in football hold to right breast when I went into the room, she held breast in mouth and would not latch initially, she was fussy. She was repositioned a few times and self latched in the laid back position to both breasts for about 10 minutes on each side.  Infant fed and detached independently.  Mom with firm breasts and semi compressible short shaft everted nipples, left nipple thickens slightly with compression, right nipple everts more with stimulation. Unable to hand express colostrum from either breast. Mom is an travel Charity fundraiserN and GM is Charity fundraiserN on Women's Unit at [facility]. Dad is a travel Engineer, civil (consulting)nurse [place].   Enc mom to feed infant 8-12 x in 24 hours at first feeding cues, mom voiced understanding. Discussed BF basics, Colostrum, milk coming to volume, positioning and hand expression prior to latch. Mom very sleepy with  Feeding and will need reiteration of teaching.  BF Resources Handout and LC Brochure given, mom informed of BF Support Groups, IP/OP Services, and LC phone #. Enc mom to call out to desk for feeding assistance as needed. Follow up tomorrow and prn.   Maternal Data Formula Feeding for Exclusion: No Does the patient have breastfeeding experience prior to this delivery?: No  Feeding Feeding Type: Breast Fed Length of feed: 20 min  LATCH Score/Interventions Latch: Repeated attempts needed to sustain latch, nipple held in mouth throughout feeding, stimulation needed to elicit sucking reflex. Intervention(s): Breast compression;Breast massage;Assist with latch;Adjust position  Audible Swallowing: A few with  stimulation Intervention(s): Skin to skin;Hand expression Intervention(s): Alternate breast massage  Type of Nipple: Everted at rest and after stimulation  Comfort (Breast/Nipple): Soft / non-tender     Hold (Positioning): Assistance needed to correctly position infant at breast and maintain latch. Intervention(s): Breastfeeding basics reviewed;Support Pillows;Position options;Skin to skin  LATCH Score: 7  Lactation Tools Discussed/Used WIC Program: No   Consult Status Consult Status: Follow-up Date: 06/08/16 Follow-up type: In-patient    [name] 06/07/2016, 5:59 PM

## 2016-06-07 NOTE — Progress Notes (Signed)
Delivery of live viable female by DR Alvester MorinNewton. APGARS 8,9 NICU team present

## 2016-06-07 NOTE — Anesthesia Procedure Notes (Signed)
Epidural Patient location during procedure: OB Start time: 06/07/2016 1:11 AM  Staffing Anesthesiologist: Mal AmabileFOSTER, Parrish Bonn Performed: anesthesiologist   Preanesthetic Checklist Completed: patient identified, site marked, surgical consent, pre-op evaluation, timeout performed, IV checked, risks and benefits discussed and monitors and equipment checked  Epidural Patient position: sitting Prep: site prepped and draped and DuraPrep Patient monitoring: continuous pulse ox and blood pressure Approach: midline Location: L3-L4 Injection technique: LOR air  Needle:  Needle type: Tuohy  Needle gauge: 17 G Needle length: 9 cm and 9 Needle insertion depth: 5 cm cm Catheter type: closed end flexible Catheter size: 19 Gauge Catheter at skin depth: 10 cm Test dose: negative and Other  Assessment Events: blood not aspirated, injection not painful, no injection resistance, negative IV test and no paresthesia  Additional Notes Patient identified. Risks and benefits discussed including failed block, incomplete  Pain control, post dural puncture headache, nerve damage, paralysis, blood pressure Changes, nausea, vomiting, reactions to medications-both toxic and allergic and post Partum back pain. All questions were answered. Patient expressed understanding and wished to proceed. Sterile technique was used throughout procedure. Epidural site was Dressed with sterile barrier dressing. No paresthesias, signs of intravascular injection Or signs of intrathecal spread were encountered.  Patient was more comfortable after the epidural was dosed. Please see RN's note for documentation of vital signs and FHR which are stable.

## 2016-06-08 LAB — GLUCOSE, CAPILLARY: GLUCOSE-CAPILLARY: 121 mg/dL — AB (ref 65–99)

## 2016-06-08 NOTE — Anesthesia Postprocedure Evaluation (Signed)
Anesthesia Post Note  Patient: [name]  Procedure(s) Performed: * No procedures listed *  Patient location during evaluation: Women's Unit Anesthesia Type: Epidural Level of consciousness: awake and alert Pain management: pain level controlled Vital Signs Assessment: post-procedure vital signs reviewed and stable Respiratory status: spontaneous breathing and nonlabored ventilation Cardiovascular status: stable Postop Assessment: no headache, patient able to bend at knees, no backache, no signs of nausea or vomiting, epidural receding and adequate PO intake Anesthetic complications: no     Last Vitals:  Vitals:   06/08/16 0001 06/08/16 0620  BP: 106/62 (!) 119/58  Pulse: 99 63  Resp: 18 18  Temp: 36.8 C 36.7 C    Last Pain:  Vitals:   06/08/16 0620  TempSrc: Oral  PainSc:    Pain Goal: Patients Stated Pain Goal: 3 (06/08/16 0200)               [name],[name]

## 2016-06-08 NOTE — Progress Notes (Signed)
POSTPARTUM PROGRESS NOTE  Post Partum Day 1 Subjective:  [name] is a 35 y.o. G1P1001 7028w5d s/p SVD.  No acute events overnight.  Pt denies problems with ambulating, voiding or po intake.  She denies nausea or vomiting.  Pain is well controlled.  She has had flatus. She has not had bowel movement.  Lochia Small.   Objective: Blood pressure (!) 119/58, pulse 63, temperature 98 F (36.7 C), temperature source Oral, resp. rate 18, height 5\' 2"  (1.575 m), weight 177 lb (80.3 kg), last menstrual period 09/01/2015, SpO2 98 %, unknown if currently breastfeeding.  Physical Exam:  General: alert, cooperative and no distress Lochia:normal flow Chest: no respiratory distress Heart:regular rate, distal pulses intact Abdomen: soft, nontender,  Uterine Fundus: firm, appropriately tender DVT Evaluation: No calf swelling or tenderness Extremities: trace edema   Recent Labs  06/07/16 0015 06/07/16 1830  HGB 13.0 11.5*  HCT 38.1 33.7*    Assessment/Plan:  ASSESSMENT: Lisa MoosKristine G Allmon is a 35 y.o. G1P1001 5628w5d s/p SVD  Plan for discharge tomorrow and Breastfeeding Ambulation today.   LOS: 2 days   Les Pouicholas SchenkMD 06/08/2016, 7:41 AM

## 2016-06-08 NOTE — Lactation Note (Signed)
This note was copied from a baby's chart. Lactation Consultation Note  Patient Name: [name] ZOXWR'UToday's Date: 06/08/2016 Reason for consult: Follow-up assessment Baby at 27 hr of life. Mom called for lactation because baby would no latch to the L breast. Mom placed baby on football position. Demonstrated breast compression, after 2 attempts baby was able to latch comfortably and maintain long bursts of sucking. Mom is aware of lactation services and support group. She will call as needed.   Maternal Data    Feeding Feeding Type: Breast Fed  LATCH Score/Interventions Latch: Repeated attempts needed to sustain latch, nipple held in mouth throughout feeding, stimulation needed to elicit sucking reflex. Intervention(s): Adjust position;Assist with latch;Breast massage;Breast compression  Audible Swallowing: A few with stimulation Intervention(s): Hand expression;Skin to skin Intervention(s): Alternate breast massage  Type of Nipple: Everted at rest and after stimulation  Comfort (Breast/Nipple): Filling, red/small blisters or bruises, mild/mod discomfort  Problem noted: Mild/Moderate discomfort Interventions (Mild/moderate discomfort): Hand expression  Hold (Positioning): Assistance needed to correctly position infant at breast and maintain latch. Intervention(s): Support Pillows;Position options  LATCH Score: 6  Lactation Tools Discussed/Used     Consult Status Consult Status: Follow-up Date: 06/09/16 Follow-up type: In-patient    Rulon Eisenmengerlizabeth E Merl Bommarito 06/08/2016, 8:12 PM

## 2016-06-08 NOTE — Lactation Note (Signed)
This note was copied from a baby's chart. Lactation Consultation Note  Patient Name: [name] Reason for consult: Follow-up assessment Mom states baby  Is doing fair at breast. Complaints of right nipple soreness.  Nipple is slightly red with small abrasion noted.  Baby has been mostly feeding on right side.  Mom is applying coconut oil to nipples after feeding.  Discussed importance of obtaining deep latch.  Baby is currently sleeping in mom's arms.  Offered assist with a feeding and mom agreeable.  Baby woke easily when unwrapped.  Positioned baby skin to skin in football hold on left.  Areolar tissue is somewhat firm and difficult to compress.  Baby did latch on easily,  initially shallow but then became deeper as feeding progressed.  Mom comfortable after initial latch on pain.  Baby nursed actively.  Encouraged to call for concerns/assist prn. Maternal Data    Feeding Feeding Type: Breast Fed Length of feed: 30 min  LATCH Score/Interventions Latch: Grasps breast easily, tongue down, lips flanged, rhythmical sucking. Intervention(s): Adjust position;Assist with latch;Breast massage;Breast compression  Audible Swallowing: A few with stimulation Intervention(s): Skin to skin;Hand expression;Alternate breast massage  Type of Nipple: Everted at rest and after stimulation  Comfort (Breast/Nipple): Filling, red/small blisters or bruises, mild/mod discomfort  Problem noted: Mild/Moderate discomfort  Hold (Positioning): Assistance needed to correctly position infant at breast and maintain latch. Intervention(s): Breastfeeding basics reviewed;Support Pillows;Position options;Skin to skin  LATCH Score: 7  Lactation Tools Discussed/Used     Consult Status Consult Status: Follow-up Date: 06/09/16 Follow-up type: In-patient    Huston FoleyMOULDEN, Renelle Stegenga S Stanley, 3:37 PM

## 2016-06-09 MED ORDER — SENNOSIDES-DOCUSATE SODIUM 8.6-50 MG PO TABS: 1.0000 | ORAL_TABLET | Freq: Every day | ORAL | 0 refills | Status: DC

## 2016-06-09 MED ORDER — IBUPROFEN 600 MG PO TABS: 600.0000 mg | ORAL_TABLET | Freq: Four times a day (QID) | ORAL | 0 refills | Status: AC

## 2016-06-09 NOTE — Progress Notes (Signed)
Discharge teaching complete. Pt understood all instructions and did not have any questions. Pt pushed via wheelchair and discharged home to family. 

## 2016-06-09 NOTE — Lactation Note (Signed)
This note was copied from a baby's chart. Lactation Consultation Note  Patient Name: Lisa Mickeal NeedyKristine Kise ZOXWR'UToday's Date: 06/09/2016 Reason for consult: Follow-up assessment Follow up visit made prior to discharge.  Mom feels breastfeeding is going better although baby sometimes bites.  Observed mom latch baby to right breast with little assist needed.  Mom felt initial latch on pain and then was comfortable throughout feeding.  Discharge instructions given and questions answered.  Lactation outpatient services and support information reviewed and encouraged prn.  Maternal Data    Feeding Feeding Type: Breast Fed Length of feed: 15 min  LATCH Score/Interventions Latch: Grasps breast easily, tongue down, lips flanged, rhythmical sucking. Intervention(s): Adjust position;Assist with latch;Breast massage;Breast compression  Audible Swallowing: A few with stimulation Intervention(s): Alternate breast massage  Type of Nipple: Everted at rest and after stimulation  Comfort (Breast/Nipple): Filling, red/small blisters or bruises, mild/mod discomfort  Problem noted: Mild/Moderate discomfort  Hold (Positioning): Assistance needed to correctly position infant at breast and maintain latch. Intervention(s): Breastfeeding basics reviewed;Support Pillows;Position options  LATCH Score: 7  Lactation Tools Discussed/Used     Consult Status      Huston FoleyMOULDEN, Alexia Dinger S 06/09/2016, 12:12 PM

## 2016-06-09 NOTE — Discharge Summary (Signed)
OB Discharge Summary     Patient Name: [name] DOB: March 17, 1981 MRN: [id number]  Date of admission: 06/06/2016 Delivering MD: Lyndel Safe NILES   Date of discharge: 06/09/2016  Admitting diagnosis: INDUCTION Intrauterine pregnancy: [redacted]w[redacted]d     Secondary diagnosis:  Active Problems:   Non-reactive NST (non-stress test)   Gestational hypertension w/o significant proteinuria in 3rd trimester   GDM, class A2   SVD (spontaneous vaginal delivery)  Additional problems: GDMA2, gHTN, episode of paroxysmal SVT this pregnancy, AMA     Discharge diagnosis: Term Pregnancy Delivered                                                                                                Post partum procedures:none  Augmentation: AROM  Complications: None  Hospital course:  Induction of Labor With Vaginal Delivery   35 y.o. yo G1P1001 at [redacted]w[redacted]d was admitted to the hospital 06/06/2016 for induction of labor.  Indication for induction: Gestational hypertension, A2 DM and nonreactive NST.  Patient had an uncomplicated labor course as follows: Membrane Rupture Time/Date: 1:32 AM ,06/07/2016   Intrapartum Procedures: Episiotomy: None [1]                                         Lacerations:  2nd degree [3]  Patient had delivery of a Viable infant.  Information for the patient's newborn:  Magon, Croson [[id number]]  Delivery Method: Vag-Spont   06/07/2016  Details of delivery can be found in separate delivery note.  Patient had a routine postpartum course. Patient is discharged home 06/09/16.   Physical exam Vitals:   06/08/16 0620 06/08/16 1825 06/08/16 2234 06/09/16 0540  BP: (!) 119/58 118/71 130/85 116/70  Pulse: 63 72 72 65  Resp: 18 18 18 18   Temp: 98 F (36.7 C) 98.4 F (36.9 C) 98.5 F (36.9 C) 98.5 F (36.9 C)  TempSrc: Oral Oral Oral Oral  SpO2: 98% 99% 99% 100%  Weight:      Height:       General: alert, cooperative and no distress Lochia: appropriate Uterine  Fundus: firm DVT Evaluation: No evidence of DVT seen on physical exam. Negative Homan's sign. Labs: Lab Results  Component Value Date   WBC 14.7 (H) 06/07/2016   HGB 11.5 (L) 06/07/2016   HCT 33.7 (L) 06/07/2016   MCV 83.8 06/07/2016   PLT 225 06/07/2016   CMP Latest Ref Rng & Units 06/06/2016  Glucose 65 - 99 mg/dL 86  BUN 6 - 20 mg/dL 13  Creatinine 7.82 - 9.56 mg/dL 2.13  Sodium 086 - 578 mmol/L 133(L)  Potassium 3.5 - 5.1 mmol/L 4.6  Chloride 101 - 111 mmol/L 107  CO2 22 - 32 mmol/L 18(L)  Calcium 8.9 - 10.3 mg/dL 9.3  Total Protein 6.5 - 8.1 g/dL 6.5  Total Bilirubin 0.3 - 1.2 mg/dL 0.3  Alkaline Phos 38 - 126 U/L 127(H)  AST 15 - 41 U/L 25  ALT 14 - 54 U/L 15  Discharge instruction: per After Visit Summary and "Baby and Me Booklet".  After visit meds:    Medication List    STOP taking these medications   doxylamine (Sleep) 25 MG tablet Commonly known as:  UNISOM   glucose blood test strip   glyBURIDE 2.5 MG tablet Commonly known as:  DIABETA     TAKE these medications   ibuprofen 600 MG tablet Commonly known as:  ADVIL,MOTRIN Take 1 tablet (600 mg total) by mouth every 6 (six) hours.   MIRALAX PO Take 17 g by mouth daily.   multivitamin-prenatal 27-0.8 MG Tabs tablet Take 1 tablet by mouth daily at 12 noon.   senna-docusate 8.6-50 MG tablet Commonly known as:  Senokot-S Take 1 tablet by mouth daily.       Diet: routine diet  Activity: Advance as tolerated. Pelvic rest for 6 weeks.   Outpatient follow up:6 weeks Follow up Appt:No future appointments. Follow up Visit: Follow-up Information    [facility]   Specialty:  Obstetrics and Gynecology Why:  postpartum visit Contact information: [address] [place] [phone]          Postpartum contraception: Progesterone only pills  Newborn Data: Live born female  Birth Weight: 7 lb 6 oz (3345  g) APGAR: 5, 9  Baby Feeding: Breast Disposition:home with mother   06/09/2016 Leland HerElsia J Lashonta Pilling, DO PGY-1

## 2016-06-09 NOTE — Discharge Instructions (Signed)

## 2016-06-11 ENCOUNTER — Telehealth (HOSPITAL_COMMUNITY): Payer: Self-pay

## 2016-06-12 ENCOUNTER — Inpatient Hospital Stay (HOSPITAL_COMMUNITY)
Admission: AD | Admit: 2016-06-12 | Discharge: 2016-06-14 | DRG: 776 | Disposition: A | Discharge status: Home / Self Care | Payer: BLUE CROSS/BLUE SHIELD | Source: Ambulatory Visit | Attending: Obstetrics and Gynecology | Admitting: Obstetrics and Gynecology

## 2016-06-12 ENCOUNTER — Encounter (HOSPITAL_COMMUNITY): Payer: Self-pay | Admitting: *Deleted

## 2016-06-12 DIAGNOSIS — O1495 Unspecified pre-eclampsia, complicating the puerperium: Secondary | ICD-10-CM | POA: Diagnosis present

## 2016-06-12 DIAGNOSIS — O1205 Gestational edema, complicating the puerperium: Secondary | ICD-10-CM | POA: Diagnosis present

## 2016-06-12 HISTORY — DX: Type 2 diabetes mellitus without complications: E11.9

## 2016-06-12 LAB — COMPREHENSIVE METABOLIC PANEL
ALBUMIN: 2.7 g/dL — AB (ref 3.5–5.0)
ALK PHOS: 96 U/L (ref 38–126)
ALT: 32 U/L (ref 14–54)
ANION GAP: 7 (ref 5–15)
AST: 32 U/L (ref 15–41)
BUN: 16 mg/dL (ref 6–20)
CALCIUM: 8.8 mg/dL — AB (ref 8.9–10.3)
CO2: 25 mmol/L (ref 22–32)
Chloride: 104 mmol/L (ref 101–111)
Creatinine, Ser: 0.61 mg/dL (ref 0.44–1.00)
GFR calc Af Amer: >60 mL/min (ref >60)
GLUCOSE: 113 mg/dL — AB (ref 65–99)
Potassium: 3.8 mmol/L (ref 3.5–5.1)
Sodium: 136 mmol/L (ref 135–145)
TOTAL PROTEIN: 6.1 g/dL — AB (ref 6.5–8.1)

## 2016-06-12 LAB — CBC
HEMATOCRIT: 32.3 % — AB (ref 36.0–46.0)
HEMOGLOBIN: 10.7 g/dL — AB (ref 12.0–15.0)
MCH: 28.2 pg (ref 26.0–34.0)
MCHC: 33.1 g/dL (ref 30.0–36.0)
MCV: 85 fL (ref 78.0–100.0)
Platelets: 276 10*3/uL (ref 150–400)
RBC: 3.8 MIL/uL — ABNORMAL LOW (ref 3.87–5.11)
RDW: 15.3 % (ref 11.5–15.5)
WBC: 10.3 10*3/uL (ref 4.0–10.5)

## 2016-06-12 LAB — URINE MICROSCOPIC-ADD ON

## 2016-06-12 LAB — PROTEIN / CREATININE RATIO, URINE
CREATININE, URINE: 62 mg/dL
Creatinine, Urine: 26 mg/dL
Protein Creatinine Ratio: 0.15 mg/mg{Cre} (ref 0.00–0.15)
Total Protein, Urine: <6 mg/dL
Total Protein, Urine: 9 mg/dL

## 2016-06-12 LAB — URINALYSIS, ROUTINE W REFLEX MICROSCOPIC
Bilirubin Urine: NEGATIVE
GLUCOSE, UA: NEGATIVE mg/dL
Ketones, ur: NEGATIVE mg/dL
Nitrite: NEGATIVE
PROTEIN: NEGATIVE mg/dL
SPECIFIC GRAVITY, URINE: 1.01 (ref 1.005–1.030)
pH: 7 (ref 5.0–8.0)

## 2016-06-12 MED ORDER — MAGNESIUM SULFATE 50 % IJ SOLN
2.0000 g/h | INTRAVENOUS | Status: DC
  Administered 2016-06-13 (×2): 2 g/h via INTRAVENOUS
  Filled 2016-06-12 (×2): qty 80

## 2016-06-12 MED ORDER — CALCIUM CARBONATE ANTACID 500 MG PO CHEW: 2.0000 | CHEWABLE_TABLET | ORAL | Status: DC | PRN

## 2016-06-12 MED ORDER — HYDRALAZINE HCL 20 MG/ML IJ SOLN
10.0000 mg | Freq: Once | INTRAMUSCULAR | Status: AC | PRN
  Administered 2016-06-12: 10 mg via INTRAVENOUS
  Filled 2016-06-12: qty 1

## 2016-06-12 MED ORDER — ACETAMINOPHEN 325 MG PO TABS
650.0000 mg | ORAL_TABLET | ORAL | Status: DC | PRN
  Administered 2016-06-13 (×5): 650 mg via ORAL
  Filled 2016-06-12 (×5): qty 2

## 2016-06-12 MED ORDER — ZOLPIDEM TARTRATE 5 MG PO TABS: 5.0000 mg | ORAL_TABLET | Freq: Every evening | ORAL | Status: DC | PRN

## 2016-06-12 MED ORDER — LACTATED RINGERS IV SOLN
INTRAVENOUS | Status: DC
  Administered 2016-06-12 – 2016-06-13 (×3): via INTRAVENOUS

## 2016-06-12 MED ORDER — FUROSEMIDE 10 MG/ML IJ SOLN
20.0000 mg | Freq: Once | INTRAMUSCULAR | Status: AC
  Administered 2016-06-12: 20 mg via INTRAVENOUS
  Filled 2016-06-12: qty 2

## 2016-06-12 MED ORDER — LACTATED RINGERS IV SOLN
INTRAVENOUS | Status: DC
  Administered 2016-06-12: 23:00:00 via INTRAVENOUS

## 2016-06-12 MED ORDER — LABETALOL HCL 5 MG/ML IV SOLN
20.0000 mg | INTRAVENOUS | Status: AC | PRN
  Administered 2016-06-12: 40 mg via INTRAVENOUS
  Administered 2016-06-12: 80 mg via INTRAVENOUS
  Administered 2016-06-12: 20 mg via INTRAVENOUS
  Filled 2016-06-12: qty 16
  Filled 2016-06-12: qty 4
  Filled 2016-06-12: qty 8

## 2016-06-12 MED ORDER — MAGNESIUM SULFATE BOLUS VIA INFUSION
4.0000 g | Freq: Once | INTRAVENOUS | Status: AC
  Administered 2016-06-12: 4 g via INTRAVENOUS
  Filled 2016-06-12: qty 500

## 2016-06-12 MED ORDER — PRENATAL MULTIVITAMIN CH
1.0000 | ORAL_TABLET | Freq: Every day | ORAL | Status: DC
  Administered 2016-06-13: 1 via ORAL
  Filled 2016-06-12 (×2): qty 1

## 2016-06-12 MED ORDER — DOCUSATE SODIUM 100 MG PO CAPS
100.0000 mg | ORAL_CAPSULE | Freq: Every day | ORAL | Status: DC
  Administered 2016-06-13 – 2016-06-14 (×2): 100 mg via ORAL
  Filled 2016-06-12 (×3): qty 1

## 2016-06-12 NOTE — MAU Note (Signed)
Pt is s/p SVD on 9/27 and has noticed increased swelling in her feet x 2 days. B/P 170/102 today at home. Reports headache off/on but tylenol relieves it.

## 2016-06-12 NOTE — Telephone Encounter (Signed)
LC telephone Call:  Mom initially called at 4:42 pm - requested a LC O/P appt this week due to sore / blistered nipples - some bleeding.  LC called mom back and left a message - avail appointment - Thursday at 2:30 pm and LC would schedule appt. If  Unable to keep appt. To call office back. @ 1st the appt. Schedule would not complete schedule . Therefore LC called  2nd time and left a message informing mom.  3rd time mom answered phone and discussed sore nipples with LC. LC reviewed basics - prior to latching the baby on the 1st breast  Breast massage, hand express, and making sure the areola compressible like a sandwich before latch. When latching firm support  And not to allow baby to nibble onto the breast, tickle upper lip until mouth is wide open, latch with breast compressions until swallows  And comfort achieved. EBM to nipples liberally. Mom asked about Coconut oil . LC responded EBM 1st , Coconut oil fine - alittle dab with do. [facility]

## 2016-06-12 NOTE — MAU Provider Note (Signed)
Chief Complaint: No chief complaint on file.   First Provider Initiated Contact with Patient 06/12/16 2102      SUBJECTIVE HPI: [name] is a 35 y.o. G1P1001 at 743w5d by LMP who presents to maternity admissions reporting increased swelling of legs and feet, h/a, and epigastric pain today. She took her BP at home and it was 170/100.  She has not taken anything for her h/a, epigastric pain, or swelling. Nothing makes her symptoms better or worse.  They are gradually worsening since onset today.  She was induced at 38 weeks for HTN and GDM but did not have magnesium sulfate during labor.  She reports normal lochia and denies abdominal pain. She denies vaginal bleeding, vaginal itching/burning, urinary symptoms, dizziness, n/v, or fever/chills.     HPI  Past Medical History:  Diagnosis Date  . Diabetes mellitus without complication (HCC)   . PCOS (polycystic ovarian syndrome)    Past Surgical History:  Procedure Laterality Date  . APPENDECTOMY     Social History   Social History  . Marital status: Single    Spouse name: N/A  . Number of children: N/A  . Years of education: N/A   Occupational History  . Not on file.   Social History Main Topics  . Smoking status: Never Smoker  . Smokeless tobacco: Never Used  . Alcohol use No  . Drug use: No  . Sexual activity: Not Currently    Birth control/ protection: None   Other Topics Concern  . Not on file   Social History Narrative  . No narrative on file   No current facility-administered medications on file prior to encounter.    Current Outpatient Prescriptions on File Prior to Encounter  Medication Sig Dispense Refill  . ibuprofen (ADVIL,MOTRIN) 600 MG tablet Take 1 tablet (600 mg total) by mouth every 6 (six) hours. 30 tablet 0  . Polyethylene Glycol 3350 (MIRALAX PO) Take 17 g by mouth daily.    . Prenatal Vit-Fe Fumarate-FA (MULTIVITAMIN-PRENATAL) 27-0.8 MG TABS tablet Take 1 tablet by mouth daily at 12 noon.      . senna-docusate (SENOKOT-S) 8.6-50 MG tablet Take 1 tablet by mouth daily. (Patient not taking: Reported on 06/12/2016) 30 tablet 0   Allergies  Allergen Reactions  . Codeine     hallucinates    ROS:  Review of Systems  Constitutional: Negative for chills, fatigue and fever.  Eyes: Negative for visual disturbance.  Respiratory: Negative for shortness of breath.   Cardiovascular: Negative for chest pain.  Gastrointestinal: Negative for abdominal pain, nausea and vomiting.       Epigastric pain  Genitourinary: Positive for vaginal bleeding. Negative for difficulty urinating, dysuria, flank pain, pelvic pain, vaginal discharge and vaginal pain.  Neurological: Negative for dizziness and headaches.  Psychiatric/Behavioral: Negative.      I have reviewed patient's Past Medical Hx, Surgical Hx, Family Hx, Social Hx, medications and allergies.   Physical Exam  Patient Vitals for the past 24 hrs:  BP Temp Temp src Pulse Resp SpO2 Height Weight  06/12/16 2044 153/84 - - (!) 57 - - - -  06/12/16 2042 164/89 - - (!) 59 16 - - -  06/12/16 2027 171/86 98.6 F (37 C) Oral 68 16 98 % 5\' 2"  (1.575 m) 170 lb (77.1 kg)   Constitutional: Well-developed, well-nourished female in no acute distress.  HEART: normal rate, heart sounds, regular rhythm RESP: normal effort, lung sounds clear and equal bilaterally GI: Abd soft, non-tender. Pos  BS x 4 MS: Extremities nontender, no edema, normal ROM Neurologic: Alert and oriented x 4.  GU: Neg CVAT.  Preeclampsia labs pending. Preeclampsia focused order set started r/t severe range BPs.  Labetalol x 2 doses IV given.  Consult Dr Vergie Living.  Admit for 24 hours magnesium sulfate.  Clear liquids.  Lasix 20 mg IV x 1 dose now.   Sharen Counter Certified Nurse-Midwife 06/12/2016  9:09 PM

## 2016-06-12 NOTE — H&P (Signed)
Chief Complaint: No chief complaint on file.   First Provider Initiated Contact with Patient 06/12/16 2102      SUBJECTIVE HPI: [name] is a 35 y.o. G1P1001 at [redacted]w[redacted]d by LMP who presents to maternity admissions reporting increased swelling of legs and feet, h/a, and epigastric pain today. She took her BP at home and it was 170/100.  She has not taken anything for her h/a, epigastric pain, or swelling. Nothing makes her symptoms better or worse.  They are gradually worsening since onset today.  She was induced at 38 weeks for HTN and GDM but did not have magnesium sulfate during labor.  She reports normal lochia and denies abdominal pain. She denies vaginal bleeding, vaginal itching/burning, urinary symptoms, dizziness, n/v, or fever/chills.     HPI  Past Medical History:  Diagnosis Date  . Diabetes mellitus without complication (HCC)   . PCOS (polycystic ovarian syndrome)    Past Surgical History:  Procedure Laterality Date  . APPENDECTOMY     Social History   Social History  . Marital status: Single    Spouse name: N/A  . Number of children: N/A  . Years of education: N/A   Occupational History  . Not on file.   Social History Main Topics  . Smoking status: Never Smoker  . Smokeless tobacco: Never Used  . Alcohol use No  . Drug use: No  . Sexual activity: Not Currently    Birth control/ protection: None   Other Topics Concern  . Not on file   Social History Narrative  . No narrative on file   No current facility-administered medications on file prior to encounter.    Current Outpatient Prescriptions on File Prior to Encounter  Medication Sig Dispense Refill  . ibuprofen (ADVIL,MOTRIN) 600 MG tablet Take 1 tablet (600 mg total) by mouth every 6 (six) hours. 30 tablet 0  . Polyethylene Glycol 3350 (MIRALAX PO) Take 17 g by mouth daily.    . Prenatal Vit-Fe Fumarate-FA (MULTIVITAMIN-PRENATAL) 27-0.8 MG TABS tablet Take 1 tablet by mouth daily at 12 noon.      . senna-docusate (SENOKOT-S) 8.6-50 MG tablet Take 1 tablet by mouth daily. (Patient not taking: Reported on 06/12/2016) 30 tablet 0   Allergies  Allergen Reactions  . Codeine     hallucinates    ROS:  Review of Systems  Constitutional: Negative for chills, fatigue and fever.  Eyes: Negative for visual disturbance.  Respiratory: Negative for shortness of breath.   Cardiovascular: Negative for chest pain.  Gastrointestinal: Negative for abdominal pain, nausea and vomiting.       Epigastric pain  Genitourinary: Positive for vaginal bleeding. Negative for difficulty urinating, dysuria, flank pain, pelvic pain, vaginal discharge and vaginal pain.  Neurological: Negative for dizziness and headaches.  Psychiatric/Behavioral: Negative.      I have reviewed patient's Past Medical Hx, Surgical Hx, Family Hx, Social Hx, medications and allergies.   Physical Exam  Patient Vitals for the past 24 hrs:  BP Temp Temp src Pulse Resp SpO2 Height Weight  06/12/16 2044 153/84 - - (!) 57 - - - -  06/12/16 2042 164/89 - - (!) 59 16 - - -  06/12/16 2027 171/86 98.6 F (37 C) Oral 68 16 98 % 5\' 2"  (1.575 m) 170 lb (77.1 kg)   Constitutional: Well-developed, well-nourished female in no acute distress.  HEART: normal rate, heart sounds, regular rhythm RESP: normal effort, lung sounds clear and equal bilaterally GI: Abd soft, non-tender. Pos  BS x 4 MS: Extremities nontender, no edema, normal ROM Neurologic: Alert and oriented x 4.  GU: Neg CVAT.  Preeclampsia labs pending. Preeclampsia focused order set started r/t severe range BPs.  Labetalol x 2 doses IV given.  Consult Dr Vergie Living.  Admit for 24 hours magnesium sulfate.  Clear liquids.  Lasix 20 mg IV x 1 dose now.   Sharen Counter Certified Nurse-Midwife 06/12/2016  9:09 PM

## 2016-06-13 LAB — CBC
HCT: 31 % — ABNORMAL LOW (ref 36.0–46.0)
HEMOGLOBIN: 10.5 g/dL — AB (ref 12.0–15.0)
MCH: 28.6 pg (ref 26.0–34.0)
MCHC: 33.9 g/dL (ref 30.0–36.0)
MCV: 84.5 fL (ref 78.0–100.0)
PLATELETS: 296 10*3/uL (ref 150–400)
RBC: 3.67 MIL/uL — ABNORMAL LOW (ref 3.87–5.11)
RDW: 15.3 % (ref 11.5–15.5)
WBC: 9 10*3/uL (ref 4.0–10.5)

## 2016-06-13 LAB — COMPREHENSIVE METABOLIC PANEL
ALBUMIN: 2.6 g/dL — AB (ref 3.5–5.0)
ALT: 31 U/L (ref 14–54)
ANION GAP: 9 (ref 5–15)
AST: 32 U/L (ref 15–41)
Alkaline Phosphatase: 90 U/L (ref 38–126)
BUN: 11 mg/dL (ref 6–20)
CALCIUM: 7.9 mg/dL — AB (ref 8.9–10.3)
CO2: 25 mmol/L (ref 22–32)
Chloride: 101 mmol/L (ref 101–111)
Creatinine, Ser: 0.52 mg/dL (ref 0.44–1.00)
GFR calc Af Amer: >60 mL/min (ref >60)
GFR calc non Af Amer: >60 mL/min (ref >60)
GLUCOSE: 103 mg/dL — AB (ref 65–99)
POTASSIUM: 3.3 mmol/L — AB (ref 3.5–5.1)
SODIUM: 135 mmol/L (ref 135–145)
Total Bilirubin: 0.6 mg/dL (ref 0.3–1.2)
Total Protein: 5.6 g/dL — ABNORMAL LOW (ref 6.5–8.1)

## 2016-06-13 LAB — TYPE AND SCREEN
ABO/RH(D): A POS
Antibody Screen: NEGATIVE

## 2016-06-13 MED ORDER — BENZOCAINE-MENTHOL 20-0.5 % EX AERO
1.0000 "application " | INHALATION_SPRAY | Freq: Four times a day (QID) | CUTANEOUS | Status: DC | PRN
  Administered 2016-06-13: 1 via TOPICAL
  Filled 2016-06-13: qty 56

## 2016-06-13 NOTE — Progress Notes (Signed)
Postpartum Progress Note  Admission Date: 06/12/2016 Current Date: 06/13/2016 7:24 AM  [name] is a 35 y.o. G1P1001 HD#2 admitted for PP severe gestational HTN. She is s/p 9/27 SVD/2nd degree  History complicated by: Patient Active Problem List   Diagnosis Date Noted  . Preeclampsia in postpartum period 06/12/2016  . SVD (spontaneous vaginal delivery) 06/09/2016  . Non-reactive NST (non-stress test) 06/06/2016  . Gestational hypertension w/o significant proteinuria in 3rd trimester 06/06/2016  . GDM, class A2 06/06/2016  . Supervision of high risk pregnancy, antepartum 05/05/2016  . Gestational diabetes mellitus (GDM) affecting pregnancy 05/05/2016  . Paroxysmal SVT (supraventricular tachycardia) (HCC) 05/05/2016  . ALLERGIC RHINITIS 02/26/2007    ROS and patient/family/surgical history, located on admission H&P note dated 06/12/2016, have been reviewed, and there are no changes except as noted below Yesterday/Overnight Events:  none  Subjective:  HA better and pt wonders if it is now from Mg. No visual s/s or s/s of Mg toxicity  Objective:   Vitals:   06/13/16 0505 06/13/16 0600 06/13/16 0605 06/13/16 0705  BP: 121/70  120/84 (!) 129/92  Pulse: 87  84 79  Resp: 18  18 18   Temp:      TempSrc:      SpO2:      Weight:  76.1 kg (167 lb 12.8 oz)    Height:        Intake/Output Summary (Last 24 hours) at 06/13/16 0724 Last data filed at 06/13/16 0700  Gross per 24 hour  Intake          1711.26 ml  Output             4500 ml  Net         -2788.74 ml  UOP>188mL/hr  Patient Vitals for the past 12 hrs:  BP Temp Temp src Pulse Resp SpO2 Height Weight  06/13/16 0705 (!) 129/92 - - 79 18 - - -  06/13/16 0605 120/84 - - 84 18 - - -  06/13/16 0600 - - - - - - - 76.1 kg (167 lb 12.8 oz)  06/13/16 0505 121/70 - - 87 18 - - -  06/13/16 0405 133/87 - - 86 20 - - -  06/13/16 0305 119/78 - - 91 20 - - -  06/13/16 0205 112/74 98.4 F (36.9 C) Oral 96 20 - - -  06/13/16  0105 118/76 - - 89 18 - - -  06/13/16 0005 119/87 - - 78 18 - - -  06/12/16 2336 125/73 - - 85 18 - - -  06/12/16 2320 122/78 - - 82 18 - - -  06/12/16 2300 133/89 98.2 F (36.8 C) Oral 81 18 97 % - -  06/12/16 2244 (!) 161/101 - - 64 - - - -  06/12/16 2231 170/78 - - (!) 59 - - - -  06/12/16 2215 (!) 181/106 - - (!) 58 - - - -  06/12/16 2212 189/94 - - (!) 55 - - - -  06/12/16 2200 (!) 174/101 - - (!) 56 - - - -  06/12/16 2145 (!) 183/104 - - 62 - - - -  06/12/16 2130 164/94 - - 60 - - - -  06/12/16 2115 177/99 - - 60 - - - -  06/12/16 2100 (!) 154/101 - - 61 - - - -  06/12/16 2044 153/84 - - (!) 57 - - - -  06/12/16 2042 164/89 - - (!) 59 16 - - -  06/12/16  2027 171/86 98.6 F (37 C) Oral 68 16 98 % 5\' 2"  (1.575 m) 77.1 kg (170 lb)    Physical exam: General appearance: alert, cooperative and appears stated age Abdomen: soft, non-tender; bowel sounds normal; no masses,  no organomegaly Lungs: clear to auscultation bilaterally Heart: S1, S2 normal, no murmur, rub or gallop, regular rate and rhythm Extremities: SCDs in place Psych: appropriate Neurologic: Grossly normal, 1+ brachial   Medications Current Facility-Administered Medications  Medication Dose Route Frequency Provider Last Rate Last Dose  . acetaminophen (TYLENOL) tablet 650 mg  650 mg Oral Q4H PRN [place]urs[place] PartyLisa A [name], CNM   650 mg at 06/13/16 0404  . calcium carbonate (TUMS - dosed in mg elemental calcium) chewable tablet 400 mg of elemental calcium  2 tablet Oral Q4H PRN [place] PartyLisa A [name], CNM      . docusate sodium (COLACE) capsule 100 mg  100 mg Oral Daily [place] PartyLisa A [name], CNM      . lactated ringers infusion   Intravenous Continuous [place] Bingharlie Cantrell Larouche, MD 75 mL/hr at 06/13/16 0641    . magnesium sulfate 40 g in lactated ringers 500 mL (0.08 g/mL) OB infusion  2 g/hr Intravenous Titrated [place] PartyLisa A [name], CNM 25 mL/hr at 06/12/16 2341 2 g/hr at 06/12/16 2341  . prenatal multivitamin tablet 1 tablet  1  tablet Oral Q1200 H[place] PartyLisa A [name], CNM      . zolpidem (AMBIEN) tablet 5 mg  5 mg Oral QHS PRN [place] PartyLisa A [name], CNM          Labs   Recent Labs Lab 06/07/16 0015 06/07/16 1830 06/12/16 2114  WBC 10.4 14.7* 10.3  HGB 13.0 11.5* 10.7*  HCT 38.1 33.7* 32.3*  PLT 269 225 276     Recent Labs Lab 06/06/16 1050 06/12/16 2114  NA 133* 136  K 4.6 3.8  CL 107 104  CO2 18* 25  BUN 13 16  CREATININE 0.58 0.61  CALCIUM 9.3 8.8*  PROT 6.5 6.1*  BILITOT 0.3 <0.1*  ALKPHOS 127* 96  ALT 15 32  AST 25 32  GLUCOSE 86 113*   PC ratio negative.  Radiology No imaging  Assessment & Plan:  Pt doing well *OB: routine PP care. No issues *Severe HTN: patient with great diuresis and hasn't needed any BP meds since leaving [facility] (labetalol 20, 40, 80 and hydral 10). Will keep close eye on BP since meds may be wearing off although with the diuresis she may not need any more meds. Continue Mg until 2300 tonight. Follow up AM [facility]LLP labs *Pain: no needs *FEN/GI: MIVF, regular diet okay since BP and s/s improved *PPx: SCDs *Dispo: possibly tomorrow.   Code Status: Prior  Total time taking care of the patient was 15 minutes, with greater than 50% of the time spent in face to face interaction with the patient.  Cornelia Copaharlie Damontae Loppnow, Jr. MD Attending Center for [facility](Faculty Practice)

## 2016-06-14 ENCOUNTER — Encounter (HOSPITAL_COMMUNITY): Payer: Self-pay

## 2016-06-14 MED ORDER — AMLODIPINE BESYLATE 5 MG PO TABS
5.0000 mg | ORAL_TABLET | Freq: Every day | ORAL | Status: DC
  Administered 2016-06-14: 5 mg via ORAL
  Filled 2016-06-14: qty 1

## 2016-06-14 MED ORDER — AMLODIPINE BESYLATE 5 MG PO TABS: 5.0000 mg | ORAL_TABLET | Freq: Every day | ORAL | 1 refills | Status: DC

## 2016-06-14 NOTE — Progress Notes (Signed)
   PRENATAL VISIT NOTE  Subjective:  [name] is a 35 y.o. G1P1001 at 5783w4d being seen today for ongoing prenatal care.  She is currently monitored for the following issues for this high-risk pregnancy and has ALLERGIC RHINITIS; Supervision of high risk pregnancy, antepartum; Gestational diabetes mellitus (GDM) affecting pregnancy; Paroxysmal SVT (supraventricular tachycardia) (HCC); Non-reactive NST (non-stress test); Gestational hypertension w/o significant proteinuria in 3rd trimester; GDM, class A2; SVD (spontaneous vaginal delivery); and Preeclampsia in postpartum period on her problem list.  Patient reports mild headache and possible LOF x 3 days.  Contractions: Irregular. Vag. Bleeding: None.  Movement: Present. Denies leaking of fluid.   The following portions of the patient's history were reviewed and updated as appropriate: allergies, current medications, past family history, past medical history, past social history, past surgical history and problem list. Problem list updated.  Objective:   Vitals:   06/06/16 0905 06/06/16 0945  BP: (!) 136/92 (!) 140/91  Pulse: 77   Weight: 177 lb 4.8 oz (80.4 kg)     Fetal Status: Fetal Heart Rate (bpm): NST Fundal Height: 39 cm Movement: Present     General:  Alert, oriented and cooperative. Patient is in no acute distress.  Skin: Skin is warm and dry. No rash noted.   Cardiovascular: Normal heart rate noted  Respiratory: Normal respiratory effort, no problems with respiration noted  Abdomen: Soft, gravid, appropriate for gestational age. Pain/Pressure: Present     Pelvic:  Cervical exam performed      1/long. Neg pool and fern   Extremities: Normal range of motion.  Edema: Trace  Mental Status: Normal mood and affect. Normal behavior. Normal judgment and thought content.   Urinalysis: Urine Protein: Trace Urine Glucose: Negative  Assessment and Plan:  Pregnancy: G1P1001 at 5262w5d  1. Gestational diabetes mellitus (GDM)  affecting pregnancy  - Fetal nonstress test reassuring, but not reactive.  2. Supervision of high risk pregnancy, antepartum, third trimester  3. GHTN vs Pre-E  Admit to BS for IOL for GHTN and NRNST per consult w/ Dr. Penne LashLeggett.  [name], CNM

## 2016-06-14 NOTE — Discharge Summary (Signed)
Physician Discharge Summary  Patient ID: [name] MRN: [id number] DOB/AGE: 1981-02-24 34 y.o.  Admit date: 06/12/2016 Discharge date: 06/14/2016  Admission Diagnoses: postpartum preeclampsia  Discharge Diagnoses:  Active Problems:   Preeclampsia in postpartum period   Discharged Condition: good  Hospital Course: Patient admitted with elevated BP and headaches on PPD#5 following [facility] at 38 [facility] Patient received magnesium sulfate during this admission for 24 hours. All labs were normal. Her BP normalized while on magnesium sulfate but started to increase again once magnesium was discontinued. Norvasc was started on day of discharge. Patient will follow up in the office for BP check. Precautions were reviewed with the patient.  Consults: None  Treatments: magnesium sulfate for seizure prophylaxis  Discharge Exam: Blood pressure 131/84, pulse 74, temperature 98.4 F (36.9 C), temperature source Oral, resp. rate 18, height 5\' 2"  (1.575 m), weight 76.1 kg (167 lb 12.8 oz), SpO2 97 %, currently breastfeeding. General appearance: alert, cooperative and no distress Resp: clear to auscultation bilaterally Cardio: regular rate and rhythm GI: soft, non-tender; bowel sounds normal; no masses,  no organomegaly Extremities: extremities normal, atraumatic, no cyanosis or edema  Disposition: 01-Home or Self Care     Medication List    STOP taking these medications   senna-docusate 8.6-50 MG tablet Commonly known as:  Senokot-S     TAKE these medications   acetaminophen 500 MG tablet Commonly known as:  TYLENOL Take 500 mg by mouth every 6 (six) hours as needed for headache.   amLODipine 5 MG tablet Commonly known as:  NORVASC Take 1 tablet (5 mg total) by mouth daily.   ibuprofen 600 MG tablet Commonly known as:  ADVIL,MOTRIN Take 1 tablet (600 mg total) by mouth every 6 (six) hours.   MIRALAX PO Take 17 g by mouth daily.   multivitamin-prenatal 27-0.8 MG  Tabs tablet Take 1 tablet by mouth daily at 12 noon.        Signed: [name] 06/14/2016, 7:21 AM

## 2016-06-14 NOTE — Progress Notes (Signed)
Discharge instructions given with pt understanding. VSS. See flow sheet for details

## 2016-06-15 ENCOUNTER — Ambulatory Visit (HOSPITAL_COMMUNITY)
Admission: RE | Admit: 2016-06-15 | Discharge: 2016-06-15 | Disposition: A | Discharge status: Home / Self Care | Payer: BLUE CROSS/BLUE SHIELD | Source: Ambulatory Visit | Attending: Obstetrics and Gynecology | Admitting: Obstetrics and Gynecology

## 2016-06-15 ENCOUNTER — Other Ambulatory Visit: Payer: Self-pay

## 2016-06-15 NOTE — Progress Notes (Signed)
Pt came in today so that she could have an Rx for all purpose nipple cream per recommendation of lactation.  [facility] @ [phone] and requested Rx for nipple cream.  Pt notified that she can go to pharmacy and pick it up.

## 2016-06-15 NOTE — Lactation Note (Signed)
Lactation Consult  Mother's reason for visit:  Mother here to work on latch due to sore and cracked nipples. Visit Type:feeding assessment    Consult:  Initial Lactation Consultant:  Michel BickersKendrick, Lace Chenevert McCoy  ________________________________________________________________________    ________________________________________________________________________  Mother's Name: [name] Type of delivery:  vaginal del  Breastfeeding Experience:  none Maternal Medical Conditions:  PIH, GDM PCOS Maternal Medications:  Prenatal vits, norvasc mirolax  ________________________________________________________________________  Breastfeeding History (Post Discharge), Mother has been breastfeeding until 2 days ago. Peds MD ordered that mother begin supplementing due to infant weight loss.   Frequency of breastfeeding: every 2-3 hours Duration of feeding:  30 mins  Supplementation  Formula:  Volume       Brand: Similac  Supplement  Method:  Bottle,   Pumping  Type of pump:  Medela pump in style Frequency:  Every 2-3 hours  Volume:  3 ounces    Infant Intake and Output Assessment  Voids:   in 24 hrs.  Color:  Clear yellow Stools:   in 24 hrs.  Color:  Yellow  ________________________________________________________________________  Maternal Breast Assessment  Breast:  Full Nipple:  Erect Pain level:  0 Pain interventions:  Bra  _______________________________________________________________________   Initial feeding assessment:   Infant latched on the left breast but only a shallow latch.  Infant's oral assessment:  WNL  Positioning:  Football Right breast  LATCH documentation:  Latch:  2 = Grasps breast easily, tongue down, lips flanged, rhythmical sucking.  Audible swallowing:  2 = Spontaneous and intermittent  Type of nipple:  2 = Everted at rest and after stimulation  Comfort (Breast/Nipple):  1 = Filling, red/small blisters or bruises, mild/mod  discomfort  Hold (Positioning):  1 = Assistance needed to correctly position infant at breast and maintain latch  LATCH score: 8   Attached assessment:  Deep  Lips flanged:  Yes.    Lips untucked:  Yes.    Suck assessment:  Displays both    Pre-feed weight: 3286,  Post-feed weight:  3330,  Amount transferred:     Total amount transferred:  44ml  Call Ob for APNO for nipples Cont to BF every 2-3 hours  Rotate positions freq. Supplement with  30-60 ml after each breastfeeding.  Mother to massage and ice breast to prevent engorgement Nap and drink to thirst Follow up prn

## 2016-06-15 NOTE — Progress Notes (Signed)
Pt called and stated that her [facility] stated that her medication for Norvasc 5 mg tablet is not there if we could prescribe it.  Called [facility] [address] and notified pharmacist of Norvasc 5 mg tablet po daily, qty 30, refill 1.  Pharmacist had no further questions.

## 2016-06-21 ENCOUNTER — Ambulatory Visit: Payer: BLUE CROSS/BLUE SHIELD | Admitting: *Deleted

## 2016-06-21 VITALS — BP 123/77 | HR 80

## 2016-06-21 DIAGNOSIS — O1495 Unspecified pre-eclampsia, complicating the puerperium: Secondary | ICD-10-CM

## 2016-06-21 NOTE — Progress Notes (Signed)
Pt denies H/A or visual disturbances.  

## 2016-07-11 ENCOUNTER — Encounter: Payer: Self-pay | Admitting: Advanced Practice Midwife

## 2016-07-11 ENCOUNTER — Ambulatory Visit: Payer: BLUE CROSS/BLUE SHIELD | Admitting: Advanced Practice Midwife

## 2016-07-11 VITALS — BP 126/89 | HR 70 | Wt 151.2 lb

## 2016-07-11 DIAGNOSIS — O9102 Infection of nipple associated with the puerperium: Principal | ICD-10-CM

## 2016-07-11 DIAGNOSIS — B3789 Other sites of candidiasis: Secondary | ICD-10-CM

## 2016-07-11 MED ORDER — FLUCONAZOLE 150 MG PO TABS: 150.0000 mg | ORAL_TABLET | Freq: Once | ORAL | 0 refills | Status: AC

## 2016-07-11 NOTE — Patient Instructions (Signed)
Breastfeeding Challenges and Solutions Even though breastfeeding is natural, it can be challenging, especially in the first few weeks after childbirth. It is normal for problems to arise when starting to breastfeed your new baby, even if you have breastfed before. This document provides some solutions to the most common breastfeeding challenges.  CHALLENGES AND SOLUTIONS Challenge--Cracked or Sore Nipples Cracked or sore nipples are commonly experienced by breastfeeding mothers. Cracked or sore nipples often are caused by inadequate latching (when your baby's mouth attaches to your breast to breastfeed). Soreness can also happen if your baby is not positioned properly at your breast. Although nipple cracking and soreness are common during the first week after birth, nipple pain is never normal. If you experience nipple cracking or soreness that lasts longer than 1 week or nipple pain, call your health care provider or lactation consultant.  Solution Ensure proper latching and positioning of your baby by following the steps below:  Find a comfortable place to sit or lie down, with your neck and back well supported.  Place a pillow or rolled up blanket under your baby to bring him or her to the level of your breast (if you are seated).  Make sure that your baby's abdomen is facing your abdomen.  Gently massage your breast. With your fingertips, massage from your chest wall toward your nipple in a circular motion. This encourages milk flow. You may need to continue this action during the feeding if your milk flows slowly.  Support your breast with 4 fingers underneath and your thumb above your nipple. Make sure your fingers are well away from your nipple and your baby's mouth.  Stroke your baby's lips gently with your finger or nipple.  When your baby's mouth is open wide enough, quickly bring your baby to your breast, placing your entire nipple and as much of the colored area around your nipple  (areola) as possible into your baby's mouth.  More areola should be visible above your baby's upper lip than below the lower lip.  Your baby's tongue should be between his or her lower gum and your breast.  Ensure that your baby's mouth is correctly positioned around your nipple (latched). Your baby's lips should create a seal on your breast and be turned out (everted).  It is common for your baby to suck for about 2-3 minutes in order to start the flow of breast milk. Signs that your baby has successfully latched on to your nipple include:   Quietly tugging or quietly sucking without causing you pain.   Swallowing heard between every 3-4 sucks.   Muscle movement above and in front of his or her ears with sucking.  Signs that your baby has not successfully latched on to nipple include:   Sucking sounds or smacking sounds from your baby while nursing.   Nipple pain.  Ensure that your breasts stay moisturized and healthy by:  Avoiding the use of soap on your nipples.   Wearing a supportive bra. Avoid wearing underwire-style bras or tight bras.   Air drying your nipples for 3-4 minutes after each feeding.   Using only cotton bra pads to absorb breast milk leakage. Leaking of breast milk between feedings is normal. Be sure to change the pads if they become soaked with milk.  Using lanolin on your nipples after nursing. Lanolin helps to maintain your skin's normal moisture barrier. If you use pure lanolin you do not need to wash it off before feeding your baby again. Pure lanolin   is not toxic to your baby. You may also hand express a few drops of breast milk and gently massage that milk into your nipples, allowing it to air dry. Challenge--Breast Engorgement Breast engorgement is the overfilling of your breasts with breast milk. In the first few weeks after giving birth, you may experience breast engorgement. Breast engorgement can make your breasts throb and feel hard, tightly  stretched, warm, and tender. Engorgement peaks about the fifth day after you give birth. Having breast engorgement does not mean you have to stop breastfeeding your baby. Solution  Breastfeed when you feel the need to reduce the fullness of your breasts or when your baby shows signs of hunger. This is called "breastfeeding on demand."  Newborns (babies younger than 4 weeks) often breastfeed every 1-3 hours during the day. You may need to awaken your baby to feed if he or she is asleep at a feeding time.  Do not allow your baby to sleep longer than 5 hours during the night without a feeding.  Pump or hand express breast milk before breastfeeding to soften your breast, areola, and nipple.  Apply warm, moist heat (in the shower or with warm water-soaked hand towels) just before feeding or pumping, or massage your breast before or during breastfeeding. This increases circulation and helps your milk to flow.  Completely empty your breasts when breastfeeding or pumping. Afterward, wear a snug bra (nursing or regular) or tank top for 1-2 days to signal your body to slightly decrease milk production. Only wear snug bras or tank tops to treat engorgement. Tight bras typically should be avoided by breastfeeding mothers. Once engorgement is relieved, return to wearing regular, loose-fitting clothes.  Apply ice packs to your breasts to lessen the pain from engorgement and relieve swelling, unless the ice is uncomfortable for you.  Do not delay feedings. Try to relax when it is time to feed your baby. This helps to trigger your "let-down reflex," which releases milk from your breast.  Ensure your baby is latched on to your breast and positioned properly while breastfeeding.  Allow your baby to remain at your breast as long as he or she is latched on well and actively sucking. Your baby will let you know when he or she is done breastfeeding by pulling away from your breast or falling asleep.  Avoid  introducing bottles or pacifiers to your baby in the early weeks of breastfeeding. Wait to introduce these things until after resolving any breastfeeding challenges.  Try to pump your milk on the same schedule as when your baby would breastfeed if you are returning to work or away from home for an extended period.  Drink plenty of fluids to avoid dehydration, which can eventually put you at greater risk of breast engorgement. If you follow these suggestions, your engorgement should improve in 24-48 hours. If you are still experiencing difficulty, call your lactation consultant or health care provider.  Challenge--Plugged Milk Ducts Plugged milk ducts occur when the duct does not drain milk effectively and becomes swollen. Wearing a tight-fitting nursing bra or having difficulty with latching may cause plugged milk ducts. Not drinking enough water (8-10 c [1.9-2.4 L] per day) can contribute to plugged milk ducts. Once a duct has become plugged, hard lumps, soreness, and redness may develop in your breast.  Solution Do not delay feedings. Feed your baby frequently and try to empty your breasts of milk at each feeding. Try breastfeeding from the affected side first so there is a   better chance that the milk will drain completely from that breast. Apply warm, moist towels to your breasts for 5-10 minutes before feeding. Alternatively, a hot shower right before breastfeeding can provide the moist heat that can encourage milk flow. Gentle massage of the sore area before and during a feeding may also help. Avoid wearing tight clothing or bras that put pressure on your breasts. Wear bras that offer good support to your breasts, but avoid underwire bras. If you have a plugged milk duct and develop a fever, you need to see your health care provider.  Challenge--Mastitis Mastitis is inflammation of your breast. It usually is caused by a bacterial infection and can cause flu-like symptoms. You may develop redness in  your breast and a fever. Often when mastitis occurs, your breast becomes firm, warm, and very painful. The most common causes of mastitis are poor latching, ineffective sucking from your baby, consistent pressure on your breast (possibly from wearing a tight-fitting bra or shirt that restricts the milk flow), unusual stress or fatigue, or missed feedings.  Solution You will be given antibiotic medicine to treat the infection. It is still important to breastfeed frequently to empty your breasts. Continuing to breastfeed while you recover from mastitis will not harm your baby. Make sure your baby is positioned properly during every feeding. Apply moist heat to your breasts for a few minutes before feeding to help the milk flow and to help your breasts empty more easily. Challenge--Thrush Thrush is a yeast infection that can form on your nipples, in your breast, or in your baby's mouth. It causes itching, soreness, burning or stabbing pain, and sometimes a rash.  Solution You will be given a medicated ointment for your nipples, and your baby will be given a liquid medicine for his or her mouth. It is important that you and your baby are treated at the same time because thrush can be passed between you and your baby. Change disposable nursing pads often. Any bras, towels, or clothing that come in contact with infected areas of your body or your baby's body need to be washed in very hot water every day. Wash your hands and your baby's hands often. All pacifiers, bottle nipples, or toys your baby puts in his or her mouth should be boiled once a day for 20 minutes. After 1 week of treatment, discard pacifiers and bottle nipples and buy new ones. All breast pump parts that touch the milk need to be boiled for 20 minutes every day. Challenge--Low Milk Supply You may not be producing enough milk if your baby is not gaining the proper amount of weight. Breast milk production is based on a supply-and-demand system. Your  milk supply depends on how frequently and effectively your baby empties your breast. Solution The more you breastfeed and pump, the more breast milk you will produce. It is important that your baby empties at least one of your breasts at each feeding. If this is not happening, then use a breast pump or hand express any milk that remains. This will help to drain as much milk as possible at each feeding. It will also signal your body to produce more milk. If your baby is not emptying your breasts, it may be due to latching, sucking, or positioning problems. If low milk supply continues after addressing these issues, contact your health care provider or a lactation specialist as soon as possible. Challenge--Inverted or Flat Nipples Some women have nipples that turn inward instead of protruding outward.   Other women have nipples that are flat. Inverted or flat nipples can sometimes make it more difficult for your baby to latch onto your breast. Solution You may be given a small device that pulls out inverted nipples. This device should be applied right before your baby is brought to your breast. You can also try using a breast pump for a short time before placing the baby at your breast. The pump can pull your nipple outwards to help your infant latch more easily. The baby's sucking motion will help the inverted nipple protrude as well.  If you have flat nipples, encourage your baby to latch onto your breast and feed frequently in the early days after birth. This will give your baby practice latching on correctly while your breast is still soft. When your milk supply increases, between the second and fifth day after birth and your breasts become full, your baby will have an easier time latching.  Contact a lactation consultant if you still have concerns. She or he can teach you additional techniques to address breastfeeding problems related to nipple shape and position.  FOR MORE INFORMATION [employer]: www.llli.org   This information is not intended to replace advice given to you by your health care provider. Make sure you discuss any questions you have with your health care provider.   Document Released: 02/19/2006 Document Revised: 09/18/2014 Document Reviewed: 02/21/2013 Elsevier Interactive Patient Education 2016 Elsevier Inc.   

## 2016-07-12 ENCOUNTER — Ambulatory Visit: Payer: BLUE CROSS/BLUE SHIELD | Admitting: Obstetrics and Gynecology

## 2016-07-14 NOTE — Progress Notes (Signed)
Subjective:     Patient ID: [name], female   DOB: [birth year], 35 y.o.   MRN: [id number]  HPI: 5 weeks Postpartum C/O right breast redness and soreness. Concerned about possible mastitis. Has been using APNO x e few weeks w/ some improvement initially, but worsening over the past few days.   Review of Systems  Constitutional: Negative for chills, diaphoresis, fatigue and fever.  Breasts: Red rash on right breast     Objective: BP 126/89   Pulse 70   Wt 151 lb 3.2 oz (68.6 kg)   Breastfeeding? Yes   BMI 27.65 kg/m   Afebrile   Physical Exam  Constitutional: She is oriented to person, place, and time. She appears well-developed and well-nourished. No distress.  Cardiovascular: Normal rate.   Pulmonary/Chest: Effort normal. No respiratory distress.  Genitourinary: No breast swelling or tenderness.  Neurological: She is alert and oriented to person, place, and time.  Skin: Skin is warm and dry. She is not diaphoretic.  Nursing note and vitals reviewed.  Breasts: Right breast has 8 cm diameter slightly raised red rash w/ satellite lesions surrounding and including nipple. No tenderness, mass, warmth. Milk bleb vs raised abrasion at 12' o'clock on right nipple. Nipple appears to have a crease.  Left breast normal    Assessment:     1. Yeast infection of nipple, postpartum     Plan:     - Recommend Dr. Ree KidaJack Newman's nipple candida protocol - fluconazole (DIFLUCAN) 150 MG tablet; Take 1 tablet (150 mg total) by mouth once. Two tablets, then 1 tablet every three days x 3 doses  Dispense: 1 tablet; Refill: 0 - See LC for assistance w/ latch  [name], CNM 07/15/2016 12:01 AM

## 2016-07-15 ENCOUNTER — Other Ambulatory Visit: Payer: Self-pay | Admitting: Advanced Practice Midwife

## 2016-07-15 MED ORDER — FLUCONAZOLE 100 MG PO TABS: 100.0000 mg | ORAL_TABLET | Freq: Two times a day (BID) | ORAL | 1 refills | Status: DC

## 2016-07-17 ENCOUNTER — Other Ambulatory Visit: Payer: Self-pay

## 2016-07-17 ENCOUNTER — Other Ambulatory Visit: Payer: Self-pay | Admitting: Advanced Practice Midwife

## 2016-07-17 NOTE — Telephone Encounter (Signed)
Per [name] patient has been prescribed Diflucan for yeast infection. Patient has been advised to pick up medications and start taking.

## 2016-07-18 ENCOUNTER — Other Ambulatory Visit: Payer: Self-pay

## 2016-07-18 MED ORDER — FLUCONAZOLE 100 MG PO TABS: 100.0000 mg | ORAL_TABLET | Freq: Two times a day (BID) | ORAL | 1 refills | Status: DC

## 2016-07-18 NOTE — Telephone Encounter (Signed)
Pt called and stated that her Diflucan medication is not at her pharmacy.  Contacted [facility] and spoke with pharmacist and informed of Diflucan 100 mg po bid for two weeks.  Taking Diflucan 400 mg once on the first day then 100 mg po bid.  Notified pt.

## 2016-07-19 ENCOUNTER — Encounter: Payer: Self-pay | Admitting: Obstetrics & Gynecology

## 2016-07-19 ENCOUNTER — Ambulatory Visit (INDEPENDENT_AMBULATORY_CARE_PROVIDER_SITE_OTHER): Payer: BLUE CROSS/BLUE SHIELD | Admitting: Obstetrics & Gynecology

## 2016-07-19 DIAGNOSIS — O9102 Infection of nipple associated with the puerperium: Secondary | ICD-10-CM

## 2016-07-19 DIAGNOSIS — B3789 Other sites of candidiasis: Secondary | ICD-10-CM

## 2016-07-19 DIAGNOSIS — O1495 Unspecified pre-eclampsia, complicating the puerperium: Secondary | ICD-10-CM

## 2016-07-19 MED ORDER — NORETHINDRONE 0.35 MG PO TABS: 1.0000 | ORAL_TABLET | Freq: Every day | ORAL | 11 refills | Status: AC

## 2016-07-19 MED ORDER — NYSTATIN 100000 UNIT/GM EX CREA: 1.0000 "application " | TOPICAL_CREAM | Freq: Two times a day (BID) | CUTANEOUS | 2 refills | Status: AC

## 2016-07-19 NOTE — Progress Notes (Signed)
Subjective:     [name] is a 35 y.o. [id number] female who presents for a postpartum visit. She is 6 weeks postpartum following a spontaneous vaginal delivery. I have fully reviewed the prenatal and intrapartum course; she had A1GDM and preeclampsia. The delivery was at 5253w5d gestational weeks. Outcome: spontaneous vaginal delivery. Anesthesia: epidural. Postpartum course has been complicated by postpartum preeclampsia, requiring admission.  She is on Norvasc 5 mg daily which helps with her BP.  Patient also had yeast mastitis, currently taking Diflucan regimen but wants Nystatin cream. Baby's course has been uncomplicated. Baby is feeding by breast. Bleeding no bleeding. Bowel function is normal. Bladder function is normal. Patient is not sexually active. Desired contraception method is oral progesterone-only contraceptive. Postpartum depression screening: negative.  The following portions of the patient's history were reviewed and updated as appropriate: allergies, current medications, past family history, past medical history, past social history, past surgical history and problem list.  Review of Systems Pertinent items noted in HPI and remainder of comprehensive ROS otherwise negative.   Objective:    BP (!) 133/98   Wt 150 lb 9.6 oz (68.3 kg)   Breastfeeding? Yes   BMI 27.55 kg/m   General:  alert and no distress   Breasts:  Diffuse erythema around bilateral nipples,no masses bilaterally  Lungs: clear to auscultation bilaterally  Heart:  regular rate and rhythm  Abdomen: soft, non-tender; bowel sounds normal; no masses,  no organomegaly   Pelvic:  NEFG. Well-healed laceration.         Assessment:   Normal postpartum exam.   Plan:   1. Contraception: oral progesterone-only contraceptive prescribed 2. Nystatin prescribed for yeast mastitis. 3. Patient finishing course of prednisone. Will delay 2 hr gtt for now.   Jaynie CollinsUGONNA  Nashaun Hillmer, MD, FACOG Attending Obstetrician &  Gynecologist, [facility], [facility]

## 2016-07-26 ENCOUNTER — Other Ambulatory Visit: Payer: BLUE CROSS/BLUE SHIELD

## 2016-07-26 DIAGNOSIS — O9102 Infection of nipple associated with the puerperium: Secondary | ICD-10-CM

## 2016-07-26 DIAGNOSIS — O99814 Abnormal glucose complicating childbirth: Secondary | ICD-10-CM

## 2016-07-26 DIAGNOSIS — B3789 Other sites of candidiasis: Secondary | ICD-10-CM

## 2016-07-26 MED ORDER — FLUCONAZOLE 100 MG PO TABS: 100.0000 mg | ORAL_TABLET | Freq: Two times a day (BID) | ORAL | 0 refills | Status: DC

## 2016-07-27 LAB — GLUCOSE TOLERANCE, 2 HOURS
Glucose, 2 hour: 97 mg/dL (ref <140)
Glucose, Fasting: 95 mg/dL (ref 65–99)

## 2016-07-27 MED ORDER — FLUCONAZOLE 100 MG PO TABS: 100.0000 mg | ORAL_TABLET | Freq: Two times a day (BID) | ORAL | 0 refills | Status: AC

## 2016-08-23 ENCOUNTER — Other Ambulatory Visit: Payer: Self-pay | Admitting: Obstetrics and Gynecology

## 2016-08-29 ENCOUNTER — Encounter (INDEPENDENT_AMBULATORY_CARE_PROVIDER_SITE_OTHER): Payer: Self-pay | Admitting: Orthopaedic Surgery

## 2016-08-29 ENCOUNTER — Ambulatory Visit (INDEPENDENT_AMBULATORY_CARE_PROVIDER_SITE_OTHER): Payer: Self-pay

## 2016-08-29 ENCOUNTER — Ambulatory Visit (INDEPENDENT_AMBULATORY_CARE_PROVIDER_SITE_OTHER): Payer: BLUE CROSS/BLUE SHIELD | Admitting: Orthopaedic Surgery

## 2016-08-29 VITALS — BP 120/67 | HR 77 | Ht 62.5 in | Wt 150.0 lb

## 2016-08-29 DIAGNOSIS — G8929 Other chronic pain: Secondary | ICD-10-CM

## 2016-08-29 DIAGNOSIS — M25561 Pain in right knee: Secondary | ICD-10-CM

## 2016-08-29 NOTE — Progress Notes (Signed)
   Office Visit Note   Patient: [name]           Date of Birth: [birth year]           MRN: [id number] Visit Date: 08/29/2016              Requested by: No referring provider defined for this encounter. PCP: Leanne ChangALEJANDRO,LUIS (Inactive)   Assessment & Plan: Visit Diagnoses: I think [name] has a tear of the anterior cruciate ligament. I also think she sustained an injury to the medial collateral ligament. The MCL appears to be functional and not significantly loose.  Plan: MRI scan right knee. Follow-up after scan  Follow-Up Instructions: No Follow-up on file.   Orders:  No orders of the defined types were placed in this encounter.  No orders of the defined types were placed in this encounter.     Procedures: No procedures performed   Clinical Data: No additional findings.   Subjective: No chief complaint on file.   Pt injured her Right knee snow skiing in April, 2017. She heard something "pop". Pt was pregnant at the time and did not have any imaging. She has since delivered so is ready to have something done.  [name] injured her right knee skiing as mentioned above. She experienced pain for approximately 3 weeks after the injury associated with swelling. Over time the pain and swelling resolved but she has had several episodes of her knee giving way since September without recurrent injury or trauma. She has had some recurrent swelling of her knee. There is no numbness or tingling. She is at the point where she has a lot of activities planned in the future and she is concerned about the integrity of her knee and what she may expect over time.  Review of Systems   Objective: Vital Signs: Ht 5' 2.5" (1.588 m)   Wt 150 lb (68 kg)   BMI 27.00 kg/m   Physical Exam  Ortho Exam right knee exam was negative for effusion. There was no medial or lateral joint discomfort popping or clicking. There was a positive anterior drawer sign and positive Lachman's test.  No patellar crepitation or pain. There was no popliteal discomfort or calf pain. No swelling distally.  Specialty Comments:  No specialty comments available.  Imaging: No results found.   PMFS History: Patient Active Problem List   Diagnosis Date Noted  . Preeclampsia in postpartum period 06/12/2016  . H/O gestational diabetes mellitus, not currently pregnant 06/06/2016  . Paroxysmal SVT (supraventricular tachycardia) (HCC) 05/05/2016  . ALLERGIC RHINITIS 02/26/2007   Past Medical History:  Diagnosis Date  . Diabetes mellitus without complication (HCC)   . PCOS (polycystic ovarian syndrome)     Family History  Problem Relation Age of Onset  . Hypertension Mother   . Hypertension Father   . Cancer Maternal Grandmother     Past Surgical History:  Procedure Laterality Date  . APPENDECTOMY     Social History   Occupational History  . Not on file.   Social History Main Topics  . Smoking status: Never Smoker  . Smokeless tobacco: Never Used  . Alcohol use No  . Drug use: No  . Sexual activity: Not Currently    Birth control/ protection: None

## 2016-08-31 ENCOUNTER — Telehealth (INDEPENDENT_AMBULATORY_CARE_PROVIDER_SITE_OTHER): Payer: Self-pay | Admitting: *Deleted

## 2016-08-31 NOTE — Telephone Encounter (Signed)
[facility], pt is to arrive at 230p for check in. Left message on vm to return call for appt information

## 2016-09-08 ENCOUNTER — Ambulatory Visit (HOSPITAL_COMMUNITY): Admission: RE | Admit: 2016-09-08 | Payer: BLUE CROSS/BLUE SHIELD | Source: Ambulatory Visit

## 2016-09-14 ENCOUNTER — Ambulatory Visit (HOSPITAL_COMMUNITY): Admission: RE | Admit: 2016-09-14 | Payer: BLUE CROSS/BLUE SHIELD | Source: Ambulatory Visit

## 2016-09-19 ENCOUNTER — Ambulatory Visit (HOSPITAL_COMMUNITY): Admission: RE | Admit: 2016-09-19 | Payer: BLUE CROSS/BLUE SHIELD | Source: Ambulatory Visit

## 2016-10-08 IMAGING — US US MFM FETAL BPP W/O NON-STRESS
1 series · 14 of 28 positions shown · non-contrast
Comparison: none

[Series 1: us mfm fetal bpp w/o non-stress · 85 acquisitions, 14 frames shown]
[im 4/85]
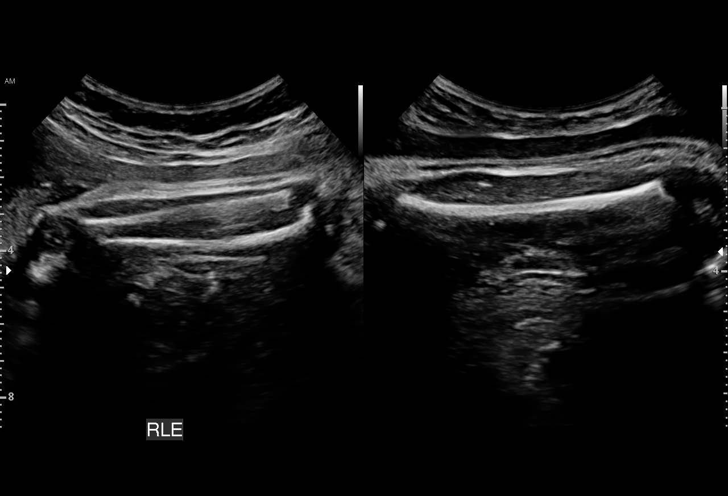
[im 10/85]
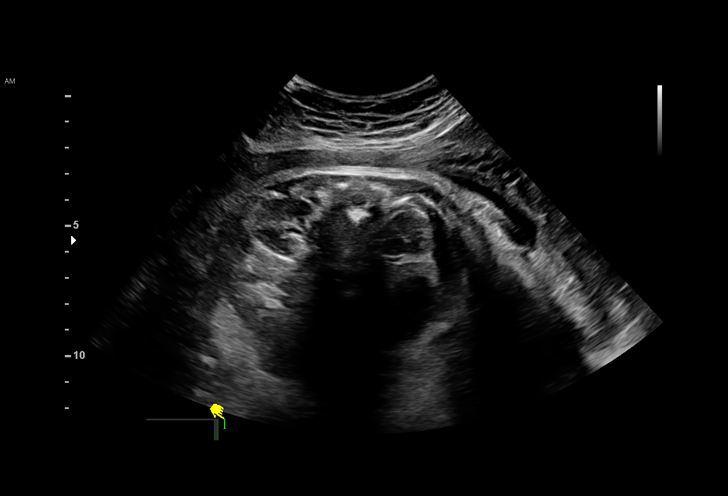
[im 16/85]
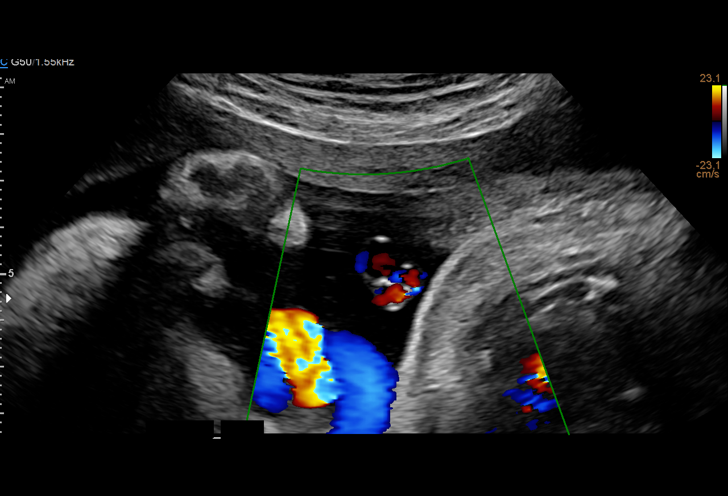
[im 22/85]
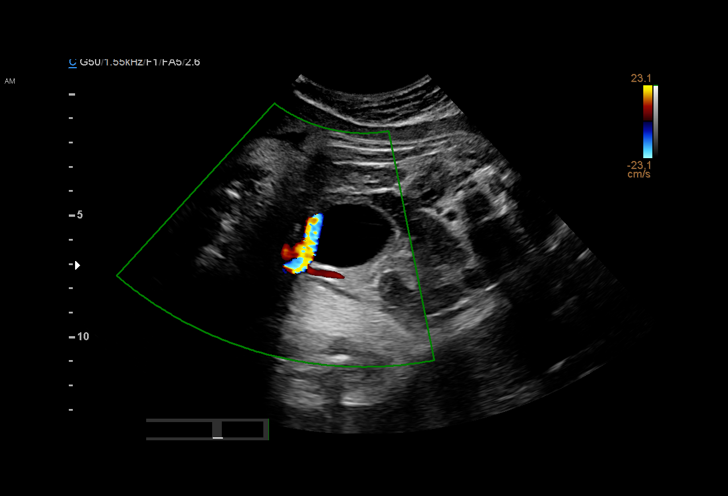
[im 29/85]
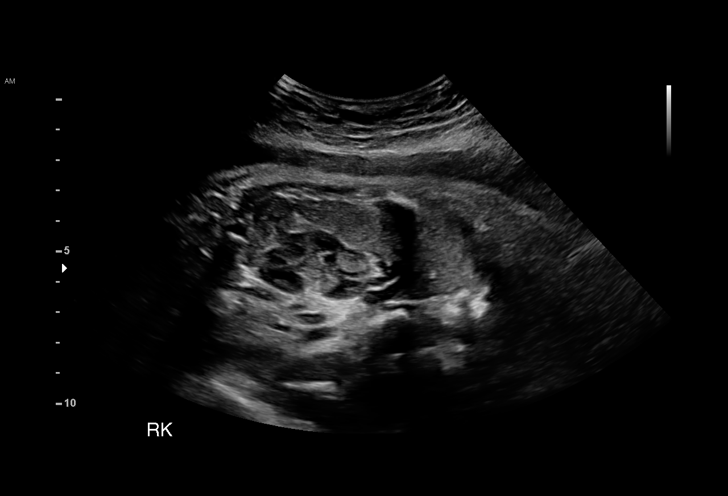
[im 35/85]
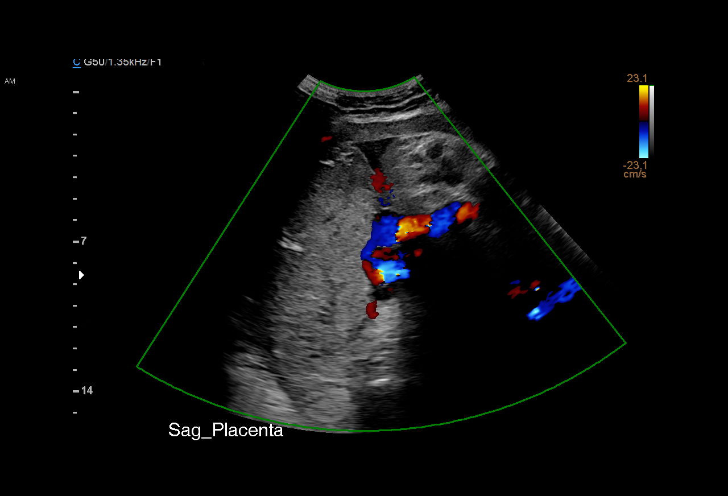
[im 41/85]
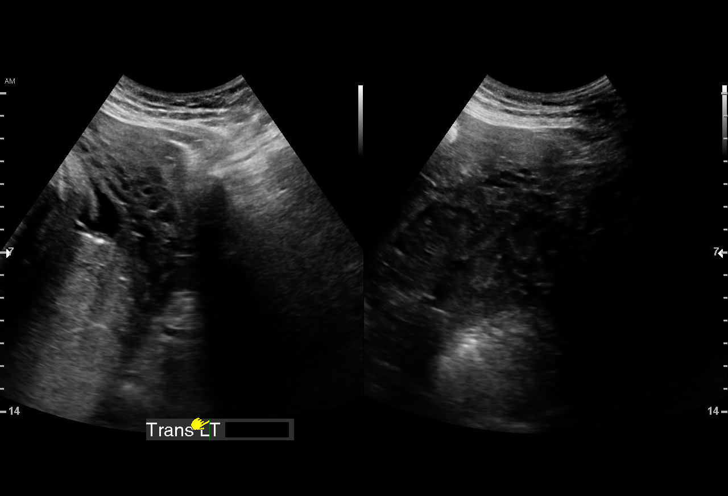
[im 47/85]
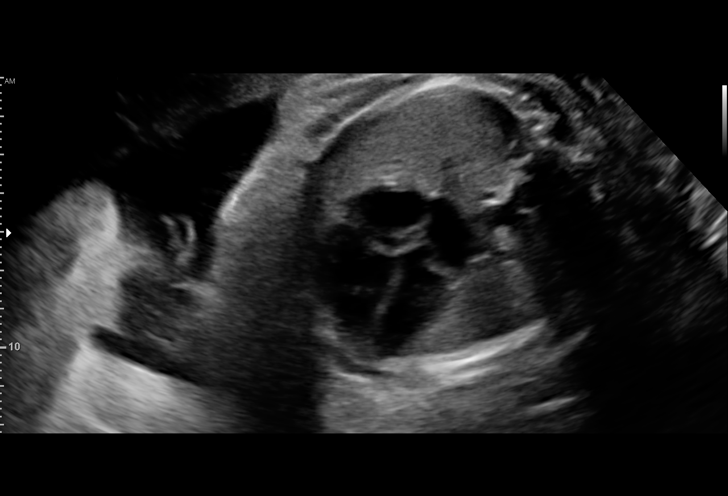
[im 53/85]
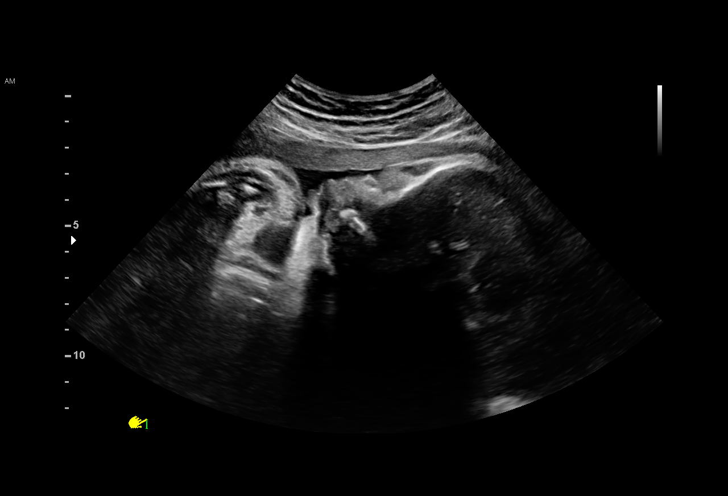
[im 60/85]
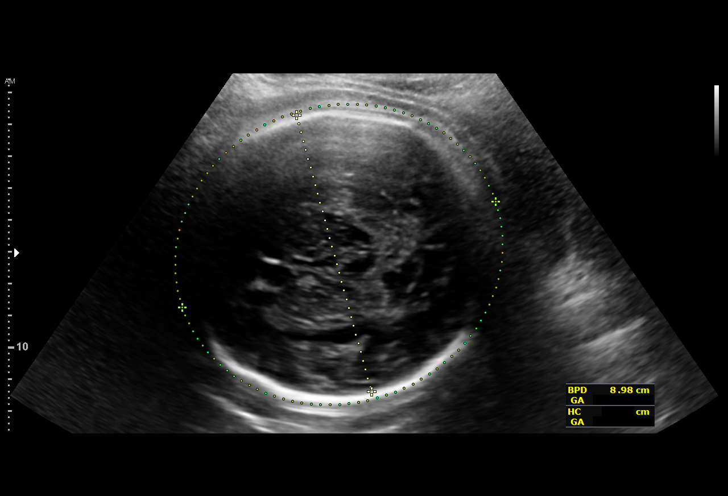
[im 66/85]
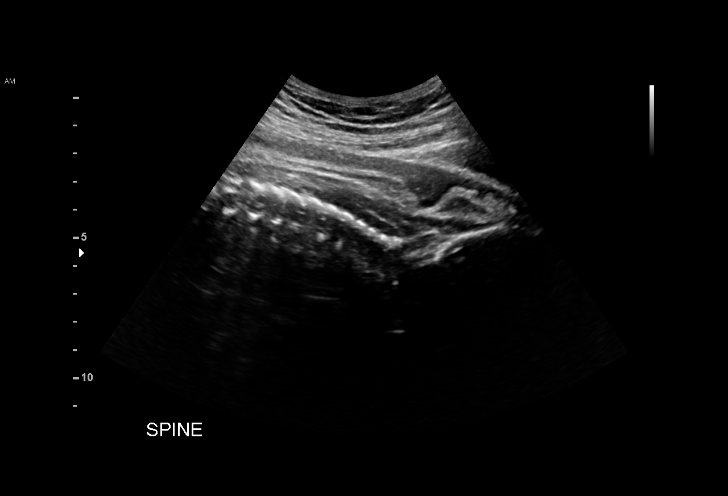
[im 72/85]
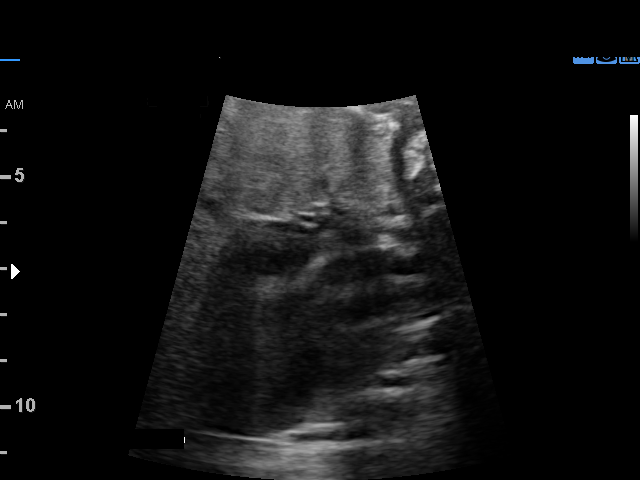
[im 78/85]
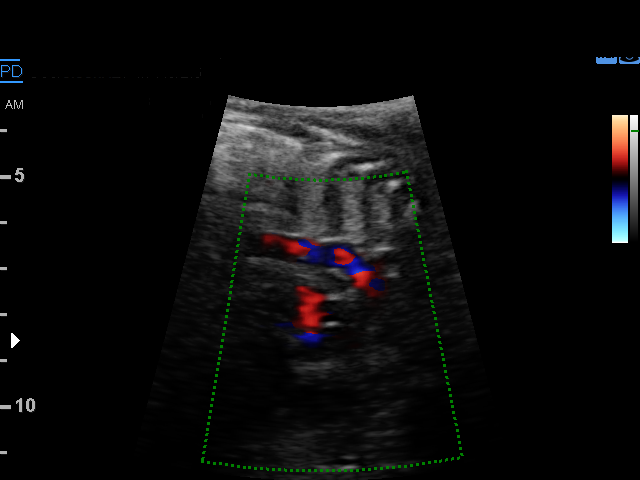
[im 85/85]
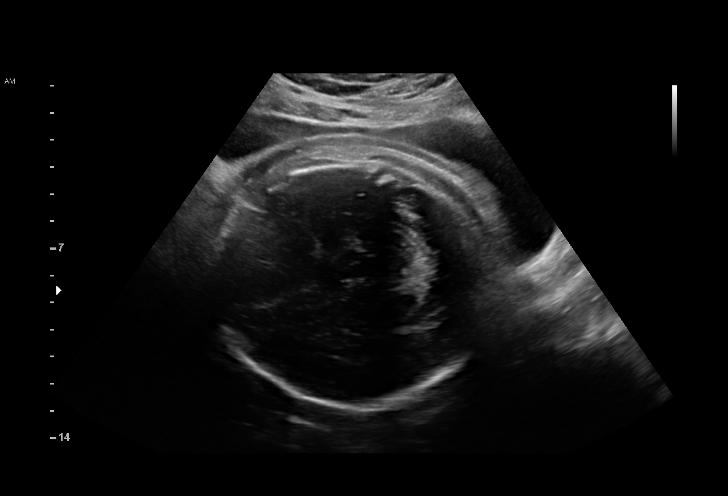

[14 of 28 positions shown; findings below may reference images not displayed]

1  JULIO MANUEL BROUSSET           [id number]       [id number]     [id number]
Indications

36 weeks gestation of pregnancy
Gestational diabetes in pregnancy,
controlled by oral hypoglycemic drugs
(glyburide)
Advanced maternal age primigravida 35+,
third trimester; low risk NIPS
OB History

Blood Type:            Height:  5'2"   Weight (lb):  174       BMI:
Gravidity:    1
Fetal Evaluation

Num Of Fetuses:     1
Fetal Heart         142
Rate(bpm):
Cardiac Activity:   Observed
Presentation:       Cephalic
Placenta:           Fundal, above cervical os
P. Cord Insertion:  Visualized, central

Amniotic Fluid
AFI FV:      Subjectively within normal limits

AFI Sum(cm)     %Tile       Largest Pocket(cm)
10.06           22

RUQ(cm)       RLQ(cm)       LUQ(cm)        LLQ(cm)
1.76
Biophysical Evaluation

Amniotic F.V:   Within normal limits       F. Tone:         Observed
F. Movement:    Observed                   Score:           [DATE]
F. Breathing:   Observed
Biometry

BPD:      89.8  mm     G. Age:  36w 3d         69  %    CI:         82.44  %    70 - 86
FL/HC:       21.3  %    20.1 -
HC:        312  mm     G. Age:  34w 6d          6  %    HC/AC:       0.89       0.93 -
AC:      352.2  mm     G. Age:  39w 1d       > 97  %    FL/BPD:      73.8  %    71 - 87
FL:       66.3  mm     G. Age:  34w 1d          8  %    FL/AC:       18.8  %    20 - 24
HUM:        58  mm     G. Age:  33w 4d         23  %

Est. FW:    3582   gm    6 lb 15 oz     84  %
Gestational Age

LMP:           37w 2d        Date:  09/01/15                 EDD:    06/07/16
U/S Today:     36w 1d                                        EDD:    06/15/16
Best:          36w 0d     Det. By:  Early Ultrasound         EDD:    06/16/16
(11/08/15)
Anatomy

Cranium:               Appears normal         Aortic Arch:            Appears normal
Cavum:                 Not well visualized    Ductal Arch:            Appears normal
Ventricles:            Not well visualized    Diaphragm:              Appears normal
Choroid Plexus:        Not well visualized    Stomach:                Appears normal, left
sided
Cerebellum:            Not well visualized    Abdomen:                Appears normal
Posterior Fossa:       Not well visualized    Abdominal Wall:         Appears nml (cord
insert, abd wall)
Nuchal Fold:           Not applicable (>20    Cord Vessels:           Appears normal (3
wks GA)                                        vessel cord)
Face:                  Orbits nl; profile not Kidneys:                Appear normal
well visualized
Lips:                  Not well visualized    Bladder:                Appears normal
Thoracic:              Appears normal         Spine:                  Ltd views no
intracranial signs of
NT
Heart:                 Appears normal         Upper Extremities:      Not well visualized
(4CH, axis, and situs
RVOT:                  Appears normal         Lower Extremities:      Not well visualized
LVOT:                  Appears normal

Other:  Fetus appears to be a female. Technically difficult due to advanced
gestational age.
Cervix Uterus Adnexa

Cervix
Not visualized (advanced GA >02wks)

Uterus
No abnormality visualized.

Left Ovary
Not visualized.
Right Ovary
Not visualized.

Adnexa:       No abnormality visualized.
Impression

SIUP at 36+0 weeks
Normal detailed fetal anatomy; limited views of intracranial
anatomy, face and details of extremities
Normal amniotic fluid volume
Measurements consistent with early US; EFW at the 84th
%tile; AC > 97th %tile
BPP [DATE]
Recommendations

Follow-up ultrasounds as clinically indicated.

## 2016-10-26 ENCOUNTER — Other Ambulatory Visit: Payer: Self-pay | Admitting: Obstetrics and Gynecology

## 2017-02-01 ENCOUNTER — Telehealth: Payer: Self-pay | Admitting: General Practice

## 2017-02-01 ENCOUNTER — Other Ambulatory Visit: Payer: Self-pay | Admitting: Obstetrics and Gynecology

## 2017-02-01 NOTE — Telephone Encounter (Signed)
Called patient and inquired about if she was still needing norvasc Rx. She states no & her blood pressure has been good at her PCP visits. Med cancelled.
